# Patient Record
Sex: Female | Born: 1935 | Race: White | Hispanic: No | Marital: Married | Smoking: Never smoker
Health system: Southern US, Community
[De-identification: ages and names within clinical notes are randomized; demographics above are authoritative.]

## PROBLEM LIST (undated history)

## (undated) DIAGNOSIS — M1990 Unspecified osteoarthritis, unspecified site: Secondary | ICD-10-CM

## (undated) DIAGNOSIS — E785 Hyperlipidemia, unspecified: Secondary | ICD-10-CM

## (undated) DIAGNOSIS — K219 Gastro-esophageal reflux disease without esophagitis: Secondary | ICD-10-CM

## (undated) DIAGNOSIS — E039 Hypothyroidism, unspecified: Secondary | ICD-10-CM

## (undated) DIAGNOSIS — I10 Essential (primary) hypertension: Secondary | ICD-10-CM

## (undated) DIAGNOSIS — K5790 Diverticulosis of intestine, part unspecified, without perforation or abscess without bleeding: Secondary | ICD-10-CM

## (undated) DIAGNOSIS — T4145XA Adverse effect of unspecified anesthetic, initial encounter: Secondary | ICD-10-CM

## (undated) DIAGNOSIS — F419 Anxiety disorder, unspecified: Secondary | ICD-10-CM

## (undated) DIAGNOSIS — K2970 Gastritis, unspecified, without bleeding: Secondary | ICD-10-CM

## (undated) DIAGNOSIS — T8859XA Other complications of anesthesia, initial encounter: Secondary | ICD-10-CM

## (undated) HISTORY — DX: Hypothyroidism, unspecified: E03.9

## (undated) HISTORY — DX: Diverticulosis of intestine, part unspecified, without perforation or abscess without bleeding: K57.90

## (undated) HISTORY — PX: TONSILLECTOMY: SUR1361

## (undated) HISTORY — DX: Anxiety disorder, unspecified: F41.9

## (undated) HISTORY — PX: FEMORAL HERNIA REPAIR: SUR1179

## (undated) HISTORY — DX: Hyperlipidemia, unspecified: E78.5

## (undated) HISTORY — PX: TONSILLECTOMY: SHX5217

## (undated) HISTORY — DX: Gastro-esophageal reflux disease without esophagitis: K21.9

## (undated) HISTORY — PX: VAGINAL HYSTERECTOMY: SHX2639

## (undated) HISTORY — DX: Gastritis, unspecified, without bleeding: K29.70

## (undated) HISTORY — PX: JOINT REPLACEMENT: SHX530

## (undated) HISTORY — DX: Essential (primary) hypertension: I10

## (undated) HISTORY — PX: OTHER SURGICAL HISTORY: SHX169

## (undated) HISTORY — PX: ABDOMINAL HYSTERECTOMY: SHX81

## (undated) HISTORY — PX: BLADDER SURGERY: SHX569

---

## 2000-01-08 ENCOUNTER — Ambulatory Visit (HOSPITAL_COMMUNITY): Admission: RE | Admit: 2000-01-08 | Discharge: 2000-01-08 | Payer: Self-pay | Admitting: Gastroenterology

## 2000-01-08 ENCOUNTER — Encounter (INDEPENDENT_AMBULATORY_CARE_PROVIDER_SITE_OTHER): Payer: Self-pay

## 2000-01-17 ENCOUNTER — Encounter: Payer: Self-pay | Admitting: Family Medicine

## 2000-01-17 ENCOUNTER — Encounter: Admission: RE | Admit: 2000-01-17 | Discharge: 2000-01-17 | Payer: Self-pay | Admitting: Family Medicine

## 2000-01-24 ENCOUNTER — Encounter: Payer: Self-pay | Admitting: Urology

## 2000-01-24 ENCOUNTER — Encounter: Admission: RE | Admit: 2000-01-24 | Discharge: 2000-01-24 | Payer: Self-pay | Admitting: Urology

## 2000-05-14 ENCOUNTER — Encounter: Admission: RE | Admit: 2000-05-14 | Discharge: 2000-05-14 | Payer: Self-pay | Admitting: Family Medicine

## 2000-05-14 ENCOUNTER — Encounter: Payer: Self-pay | Admitting: Family Medicine

## 2000-08-18 ENCOUNTER — Other Ambulatory Visit: Admission: RE | Admit: 2000-08-18 | Discharge: 2000-08-18 | Payer: Self-pay | Admitting: Gynecology

## 2001-01-19 ENCOUNTER — Encounter: Admission: RE | Admit: 2001-01-19 | Discharge: 2001-01-19 | Payer: Self-pay | Admitting: Family Medicine

## 2001-01-19 ENCOUNTER — Encounter: Payer: Self-pay | Admitting: Family Medicine

## 2001-04-09 ENCOUNTER — Encounter: Payer: Self-pay | Admitting: *Deleted

## 2001-04-09 ENCOUNTER — Encounter: Admission: RE | Admit: 2001-04-09 | Discharge: 2001-04-09 | Payer: Self-pay | Admitting: *Deleted

## 2001-04-23 ENCOUNTER — Encounter: Payer: Self-pay | Admitting: *Deleted

## 2001-04-23 ENCOUNTER — Ambulatory Visit (HOSPITAL_COMMUNITY): Admission: RE | Admit: 2001-04-23 | Discharge: 2001-04-23 | Payer: Self-pay | Admitting: *Deleted

## 2001-05-12 ENCOUNTER — Encounter: Payer: Self-pay | Admitting: Endocrinology

## 2001-05-12 ENCOUNTER — Ambulatory Visit (HOSPITAL_COMMUNITY): Admission: RE | Admit: 2001-05-12 | Discharge: 2001-05-12 | Payer: Self-pay | Admitting: Endocrinology

## 2001-05-12 ENCOUNTER — Encounter (INDEPENDENT_AMBULATORY_CARE_PROVIDER_SITE_OTHER): Payer: Self-pay

## 2001-09-24 ENCOUNTER — Other Ambulatory Visit: Admission: RE | Admit: 2001-09-24 | Discharge: 2001-09-24 | Payer: Self-pay | Admitting: Gynecology

## 2002-02-03 ENCOUNTER — Encounter: Payer: Self-pay | Admitting: Family Medicine

## 2002-02-03 ENCOUNTER — Encounter: Admission: RE | Admit: 2002-02-03 | Discharge: 2002-02-03 | Payer: Self-pay | Admitting: Family Medicine

## 2002-02-17 ENCOUNTER — Encounter: Payer: Self-pay | Admitting: Family Medicine

## 2002-02-17 ENCOUNTER — Encounter: Admission: RE | Admit: 2002-02-17 | Discharge: 2002-02-17 | Payer: Self-pay | Admitting: Family Medicine

## 2003-02-23 ENCOUNTER — Encounter: Admission: RE | Admit: 2003-02-23 | Discharge: 2003-02-23 | Payer: Self-pay | Admitting: Gynecology

## 2003-02-23 ENCOUNTER — Encounter: Payer: Self-pay | Admitting: Gynecology

## 2003-11-14 ENCOUNTER — Other Ambulatory Visit: Admission: RE | Admit: 2003-11-14 | Discharge: 2003-11-14 | Payer: Self-pay | Admitting: Gynecology

## 2004-02-27 ENCOUNTER — Encounter: Admission: RE | Admit: 2004-02-27 | Discharge: 2004-02-27 | Payer: Self-pay | Admitting: Family Medicine

## 2004-11-21 ENCOUNTER — Other Ambulatory Visit: Admission: RE | Admit: 2004-11-21 | Discharge: 2004-11-21 | Payer: Self-pay | Admitting: Gynecology

## 2004-12-21 ENCOUNTER — Encounter: Admission: RE | Admit: 2004-12-21 | Discharge: 2004-12-21 | Payer: Self-pay | Admitting: Gastroenterology

## 2005-01-07 ENCOUNTER — Ambulatory Visit (HOSPITAL_COMMUNITY): Admission: RE | Admit: 2005-01-07 | Discharge: 2005-01-07 | Payer: Self-pay | Admitting: Gastroenterology

## 2005-03-11 ENCOUNTER — Encounter: Admission: RE | Admit: 2005-03-11 | Discharge: 2005-03-11 | Payer: Self-pay | Admitting: Family Medicine

## 2005-05-26 ENCOUNTER — Observation Stay (HOSPITAL_COMMUNITY): Admission: EM | Admit: 2005-05-26 | Discharge: 2005-05-28 | Payer: Self-pay | Admitting: Emergency Medicine

## 2005-11-27 ENCOUNTER — Other Ambulatory Visit: Admission: RE | Admit: 2005-11-27 | Discharge: 2005-11-27 | Payer: Self-pay | Admitting: Gynecology

## 2005-12-25 ENCOUNTER — Encounter: Admission: RE | Admit: 2005-12-25 | Discharge: 2005-12-25 | Payer: Self-pay | Admitting: Internal Medicine

## 2006-03-18 ENCOUNTER — Encounter: Admission: RE | Admit: 2006-03-18 | Discharge: 2006-03-18 | Payer: Self-pay | Admitting: Internal Medicine

## 2006-04-01 ENCOUNTER — Encounter: Admission: RE | Admit: 2006-04-01 | Discharge: 2006-04-01 | Payer: Self-pay | Admitting: General Surgery

## 2006-04-03 ENCOUNTER — Ambulatory Visit (HOSPITAL_BASED_OUTPATIENT_CLINIC_OR_DEPARTMENT_OTHER): Admission: RE | Admit: 2006-04-03 | Discharge: 2006-04-03 | Payer: Self-pay | Admitting: General Surgery

## 2006-04-04 ENCOUNTER — Emergency Department (HOSPITAL_COMMUNITY): Admission: EM | Admit: 2006-04-04 | Discharge: 2006-04-05 | Payer: Self-pay | Admitting: Emergency Medicine

## 2006-12-10 ENCOUNTER — Other Ambulatory Visit: Admission: RE | Admit: 2006-12-10 | Discharge: 2006-12-10 | Payer: Self-pay | Admitting: Gynecology

## 2007-03-20 ENCOUNTER — Encounter: Admission: RE | Admit: 2007-03-20 | Discharge: 2007-03-20 | Payer: Self-pay | Admitting: Internal Medicine

## 2007-04-07 ENCOUNTER — Encounter: Admission: RE | Admit: 2007-04-07 | Discharge: 2007-04-07 | Payer: Self-pay | Admitting: Gastroenterology

## 2007-11-04 ENCOUNTER — Encounter: Admission: RE | Admit: 2007-11-04 | Discharge: 2007-11-04 | Payer: Self-pay | Admitting: Internal Medicine

## 2008-01-22 ENCOUNTER — Inpatient Hospital Stay (HOSPITAL_COMMUNITY): Admission: RE | Admit: 2008-01-22 | Discharge: 2008-01-27 | Payer: Self-pay | Admitting: Orthopaedic Surgery

## 2008-03-30 ENCOUNTER — Encounter: Admission: RE | Admit: 2008-03-30 | Discharge: 2008-03-30 | Payer: Self-pay | Admitting: Internal Medicine

## 2008-04-22 ENCOUNTER — Encounter: Admission: RE | Admit: 2008-04-22 | Discharge: 2008-04-22 | Payer: Self-pay | Admitting: Orthopaedic Surgery

## 2008-10-31 ENCOUNTER — Ambulatory Visit (HOSPITAL_BASED_OUTPATIENT_CLINIC_OR_DEPARTMENT_OTHER): Admission: RE | Admit: 2008-10-31 | Discharge: 2008-11-01 | Payer: Self-pay | Admitting: Urology

## 2009-03-31 ENCOUNTER — Encounter: Admission: RE | Admit: 2009-03-31 | Discharge: 2009-03-31 | Payer: Self-pay | Admitting: Internal Medicine

## 2009-05-15 IMAGING — CT CT ABDOMEN W/ CM
1 of 5 series · 14 of 36 positions shown, 19 images · IV contrast (READICAT/WATER & [ID] OMNI 300)
Comparison: CT abdomen pelvis of 05/26/2005

CT ABDOMEN

CLINICAL DATA: Right lower quadrant abdominal pain, fell 2 weeks
ago

CT ABDOMEN AND PELVIS WITH CONTRAST
TECHNIQUE: Multidetector CT imaging of the abdomen and pelvis was
performed using the standard protocol following bolus
administration of intravenous contrast.
Contrast: 100 ml [id number]

[Series 2: abdomen w/ · axial · 0.70mm/px · z∈[-398,-23]mm · 14 of 85 slices shown, 19 images]
[im 5/85  soft-tissue]
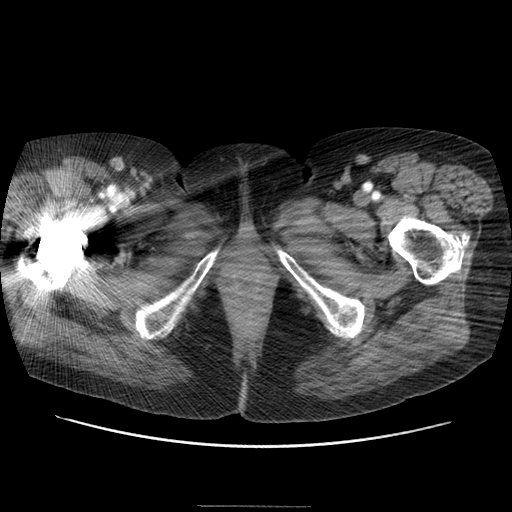
[im 5/85  bone]
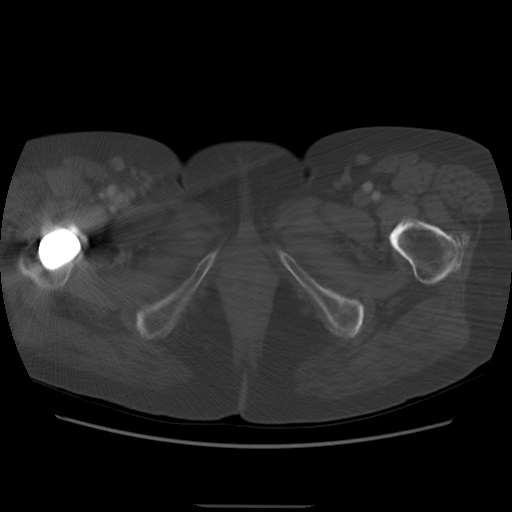
[im 10/85  soft-tissue]
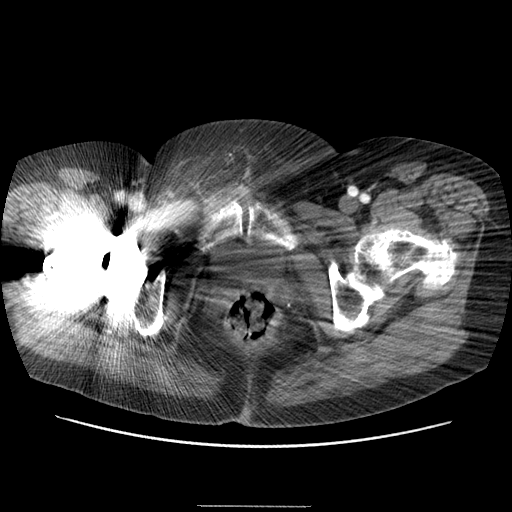
[im 20/85  soft-tissue]
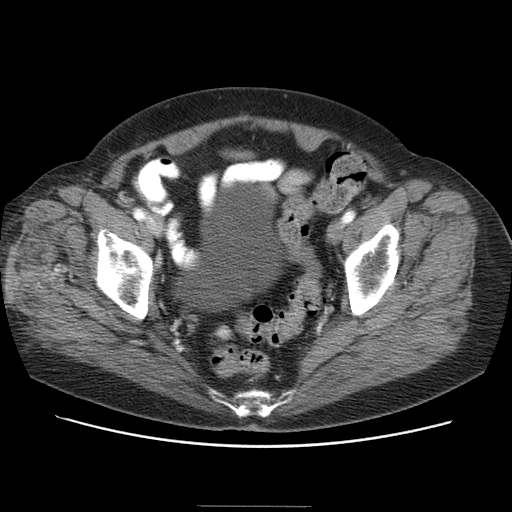
[im 25/85  soft-tissue]
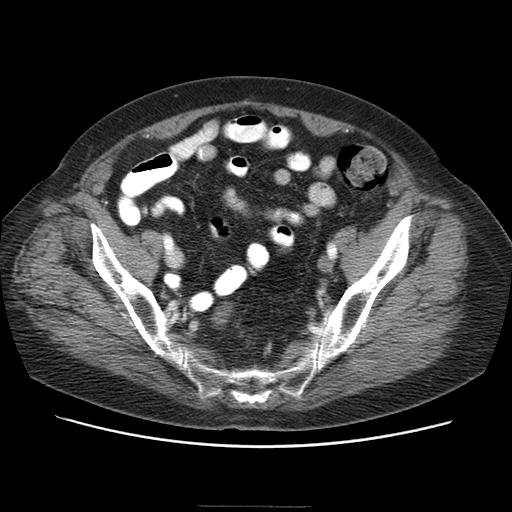
[im 30/85  soft-tissue]
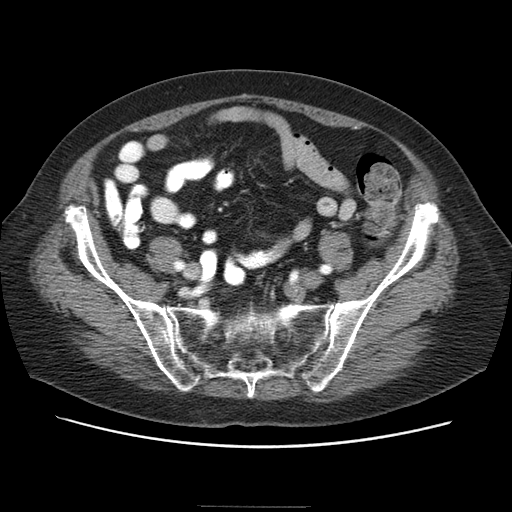
[im 35/85  soft-tissue]
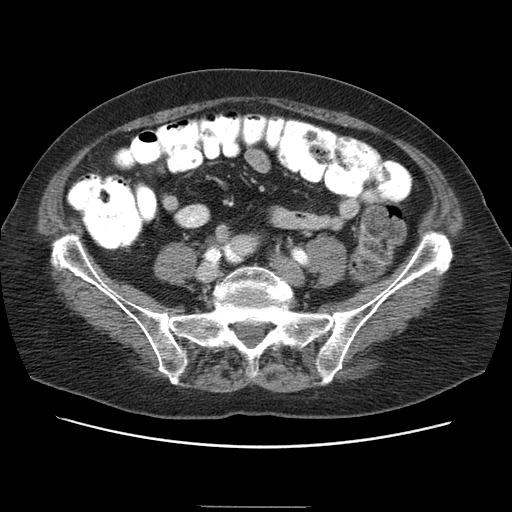
[im 45/85  soft-tissue]
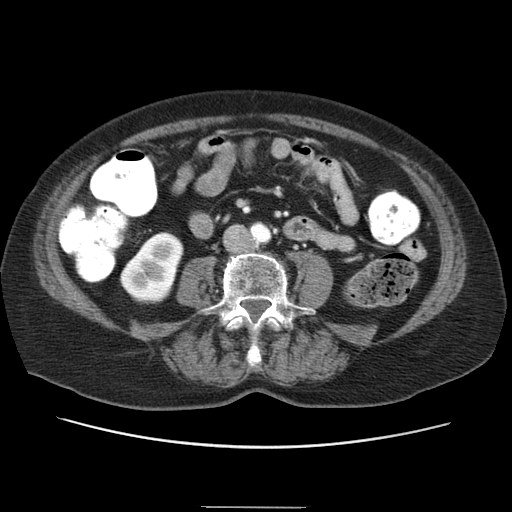
[im 50/85  soft-tissue]
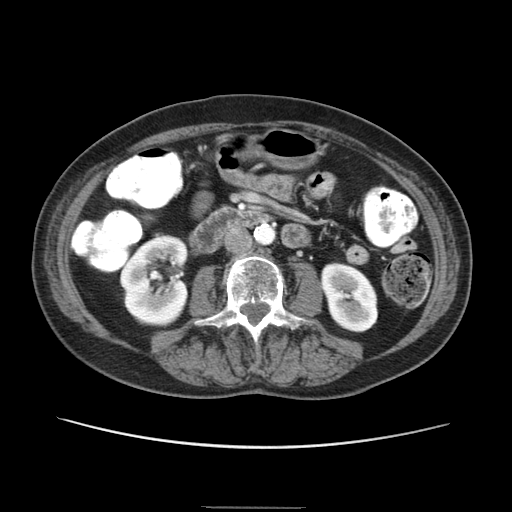
[im 55/85  soft-tissue]
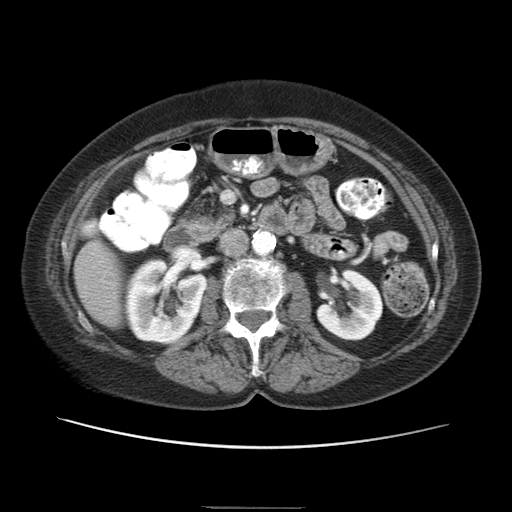
[im 55/85  bone]
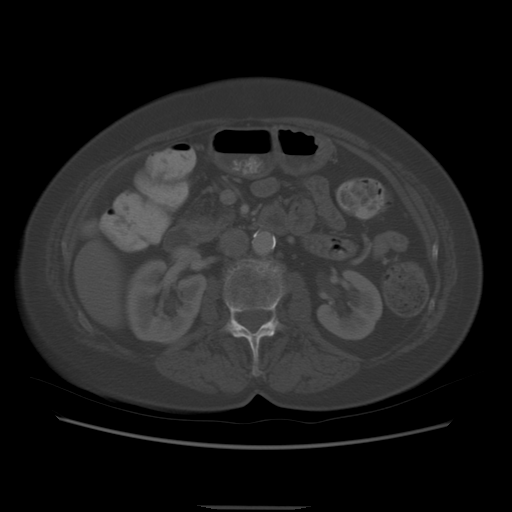
[im 60/85  soft-tissue]
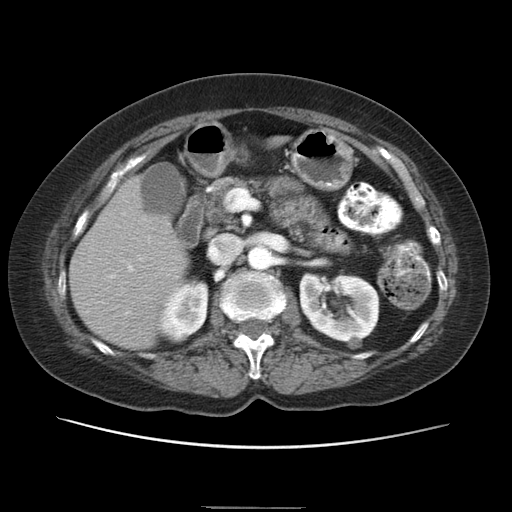
[im 65/85  soft-tissue]
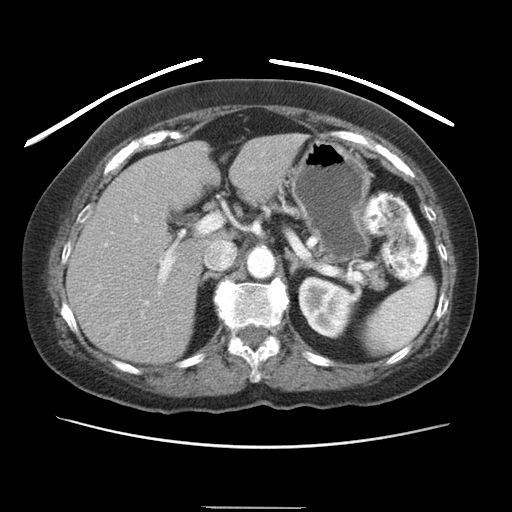
[im 65/85  lung]
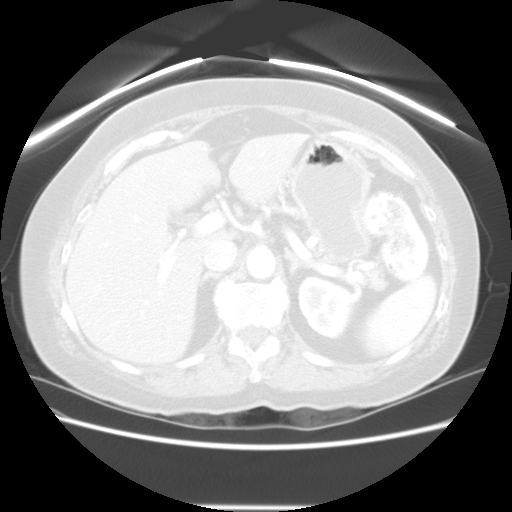
[im 70/85  lung]
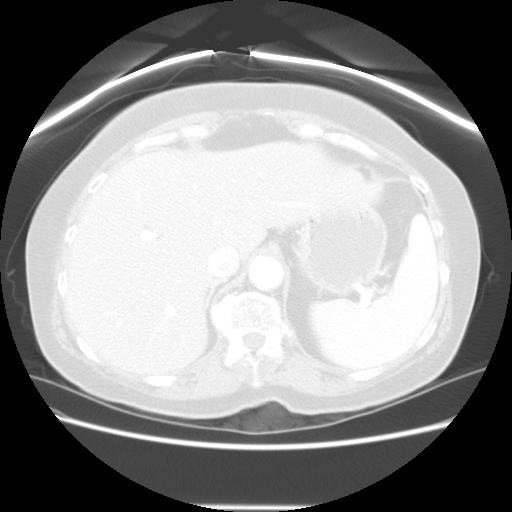
[im 75/85  soft-tissue]
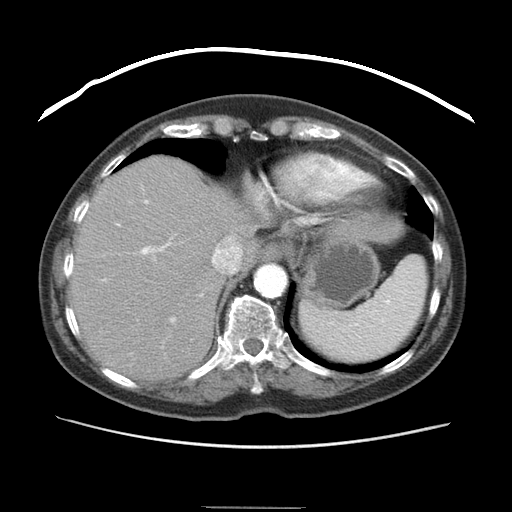
[im 75/85  lung]
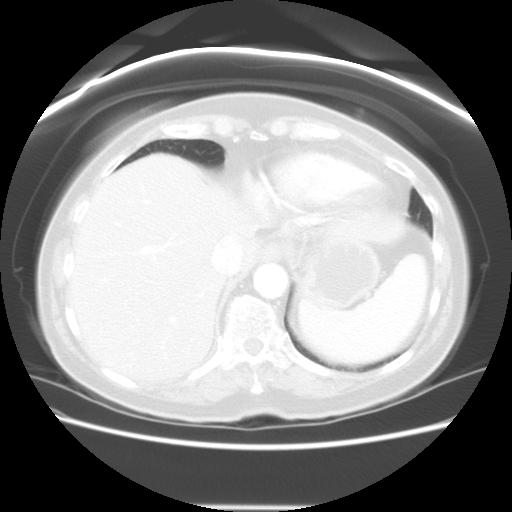
[im 80/85  soft-tissue]
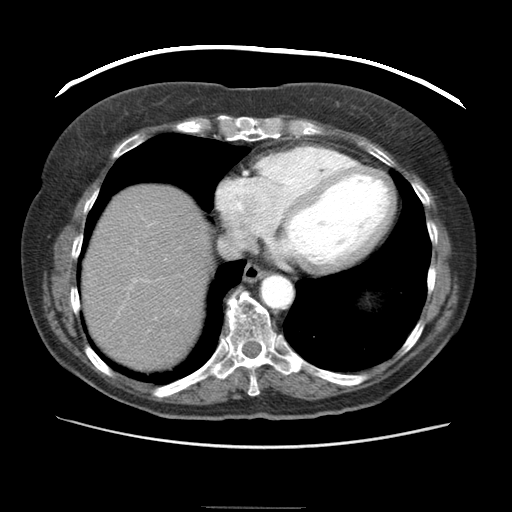
[im 80/85  lung]
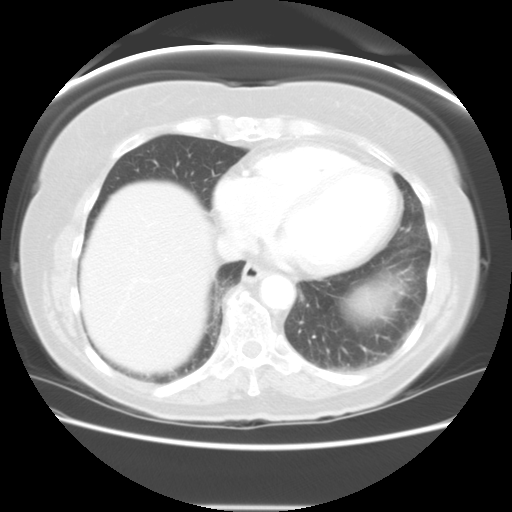

[14 of 36 positions shown; findings below may reference images not displayed]

FINDINGS: The lung bases are clear.  The liver enhances with no
focal abnormality and no ductal dilatation is seen.  A single
calcified granuloma is noted in the left lobe of liver and is
unchanged.  No calcified gallstones are seen.  The pancreas is
normal in size and the pancreatic duct is not dilated.  The adrenal
glands and spleen appear normal.  The kidneys enhance, and a small
exophytic cyst in the upper mid left kidney appears stable.  On
delayed images the pelvocaliceal systems appear normal.  Mild
atheromatous change is noted of the abdominal aorta but no aneurysm
is seen.
IMPRESSION: No significant abnormality on CT the abdomen.

CT PELVIS
FINDINGS: The urinary bladder is unremarkable although linear
artifacts as a result of right hip replacement do obscure anatomic
detail of the lower pelvis. The terminal ileum appears normal.  The
appendix is not definitely seen.  The bones are osteopenic.  There
is degenerative disc disease at L3-4.
IMPRESSION: No acute abnormality.  Linear artifacts from right hip replacement
do obscure the lower pelvis.

## 2010-04-02 ENCOUNTER — Encounter: Admission: RE | Admit: 2010-04-02 | Discharge: 2010-04-02 | Payer: Self-pay | Admitting: Internal Medicine

## 2010-07-11 ENCOUNTER — Ambulatory Visit (HOSPITAL_COMMUNITY): Admission: RE | Admit: 2010-07-11 | Payer: Self-pay | Source: Home / Self Care | Admitting: Urology

## 2010-07-13 ENCOUNTER — Ambulatory Visit (HOSPITAL_COMMUNITY)
Admission: RE | Admit: 2010-07-13 | Discharge: 2010-07-13 | Payer: Self-pay | Source: Home / Self Care | Attending: Urology | Admitting: Urology

## 2010-08-06 ENCOUNTER — Encounter: Payer: Self-pay | Admitting: Internal Medicine

## 2010-10-24 LAB — POCT I-STAT 4, (NA,K, GLUC, HGB,HCT)
Glucose, Bld: 96 mg/dL (ref 70–99)
HCT: 47 % — ABNORMAL HIGH (ref 36.0–46.0)

## 2010-11-27 NOTE — Op Note (Signed)
NAME:  CHARLITA, BRIAN             ACCOUNT NO.:  [id number]   MEDICAL RECORD NO.:  [id number]          PATIENT TYPE:  AMB   LOCATION:  NESC                         FACILITY:  [facility]   PHYSICIAN:  Sigmund I. Tannenbaum, M.D.DATE OF BIRTH:  1932/12/21   DATE OF PROCEDURE:  10/31/2008  DATE OF DISCHARGE:                               OPERATIVE REPORT   PREOPERATIVE DIAGNOSES:  Posterior pelvic floor prolapse, with the  apical descent, stress urinary continence.   OPERATION:  Posterior pelvic floor repair with posterior Pinnacle mesh,  and sacrospinous fixation, with Solyx pubovaginal sling.   SURGEON:  Sigmund I. Patsi Sears, M.D.   ANESTHESIA:  General LMA.   PREPARATION:  After appropriate preanesthesia, the patient is brought to  the operating room, placed on the operating table in dorsal supine  position where general LMA anesthesia was induced.  She was then  replaced in dorsal lithotomy position where the pubis was prepped with  Betadine solution and draped in usual fashion.   REVIEW OF HISTORY:  Mrs. Zettler is a 77 year old female, status post  bladder tack in 1999, who did well, until 2010, when she noticed  urinary incontinence, as well as a vaginal bulge.  She has been treated  with Estrace hormone vaginal replacement, but remained unhappy with the  vaginal bulge.  The patient is very active, as tour Engineer, mining, and is  on her feet all day.  She desires to have repair of her pelvic floor  prolapse.   PROCEDURE:  Vaginal pelvic examination indeed reveals of posterior  rectocele.  There is a grade one to two anterior vault prolapse, and it  is noted that the patient has a patulous urethra.  She has had  urodynamics, which has shown a stress urinary incontinence, with a  Valsalva leak point pressure of 86.  She also has some bladder  instability with a bladder contraction pressure of 9 cm of water.  Her  maximum bladder contraction pressure is 10 cm of water.   It was  elected to proceed with Solyx transurethral sling, and that the  patient would need sacrospinous fixation of apical descent for her  rectocele repair.  It is noted that the patient is status post  hysterectomy.  The anterior repair did not seem to be serious enough to  warrant repair at this time.   Marcaine with epinephrine 1:200,000 was injected in the periurethral  tissue.  The midline incision was made.  Subcutaneous tissue dissected  bilaterally to the level of the pubic tubercle.  Using the pre marked  Solyx sling, the wings of the sling were placed into the obturator  internus muscle by guiding the sling to the level of the pubic tubercle,  then going behind the pubic tubercle, at 90 degrees, and placing the  sling in the obturator internus bilaterally.  The sling was tested, and  pillowing was noted at the level of the urethra.  Right-angle clamp  easily went behind urethra, and it did not seem to be too tight.  The  wound was then closed with running 2-0 Vicryl suture.   Attention was then  placed on the rectocele, and it was elected to place  a Pinnacle posterior graft with sacrospinous fixation.  Therefore, 20 mL  of Marcaine with 1:200,000 epinephrine was injected at the rectal  mucosa.  A triangular incision was then made at the level of __________  gland, and midline incision was made.  Tissue was dissected with sharp  and blunt dissection, and the rectocele was completely dissected  laterally.  The ischial spines were then palpated.  Using the Capio  device, two throws were passed in the right and left sacrospinous  ligaments, and a the posterior Pinnacle, which was fashioned from a  regular Pinnacle, was placed without difficulty.  The mesh was trimmed,  and sutured to the peri cervical area with three separate 2-0 Vicryl  sutures.  The distal portion of the mesh was then trimmed to fit the  posterior vault, and this was also sutured to the posterior vaginal  tissue with  2-0 Vicryl suture.  The wings were brought through the  sacrospinous ligament, and excellent resolution of her rectocele was  noted, with some resolution of her cystocele as well.  The wings were  cut, plastic removed, excess mesh removed.  The wound was closed with  running 2-0 Vicryl suture.  Vaginal packing was placed, and cystoscopy  was accomplished, which showed no evidence of any suture in the bladder.  Clear urine was seen to emit from both orifices.  Digital rectal  examination prior to closure the rectocele revealed no evidence of the  rectal injury.  The patient tolerated procedure well.  She was given IV  Toradol, prior to awakening.  She was then awakened, taken to recovery  room in good condition.      Sigmund I. Patsi Sears, M.D.  Electronically Signed     SIT/MEDQ  D:  10/31/2008  T:  10/31/2008  Job:  [id number]

## 2010-11-27 NOTE — Op Note (Signed)
[name], [name] NO.:  [id number]   MEDICAL RECORD NO.:  [id number]          PATIENT TYPE:  INP   LOCATION:  5036                         FACILITY:  [facility]   PHYSICIAN:  Vanita Panda. Magnus Ivan, M.D.DATE OF BIRTH:  09/07/1932   DATE OF PROCEDURE:  01/22/2008  DATE OF DISCHARGE:                               OPERATIVE REPORT   PREOPERATIVE DIAGNOSIS:  Severe osteoarthritis/degenerative joint  disease, right hip.   POSTOPERATIVE DIAGNOSIS:  Severe osteoarthritis/degenerative joint  disease, right hip.   PROCEDURE:  Right total hip arthroplasty.   IMPLANTS:  DePuy ASR acetabular cup, size 54, size 47 hip ball, DePuy  Summit tapered hip stem with DuoFix HA coating size 6, -1 taper sleeve  adapter.   SURGEON:  Vanita Panda. Magnus Ivan, MD   ASSISTANT:  Wende Neighbors, PA   ANESTHESIA:  General.   ANTIBIOTIC:  600 mg IV clindamycin.   BLOOD LOSS:  430 mL.   COMPLICATIONS:  None.   INDICATIONS:  Briefly, [name] is a 77 year old that I have been  following for a while with severe osteoarthritis involving her right  hip.  She is a very healthy individual and I tried at least 2 different  steroid injections in her hip and she had good relief, but this is  really started to affect her activities of daily living severely.  We  thought about the total hip replacement.  She agreed to proceed with the  hip replacement after explaining the risks and benefits of this  including the risk of blood loss, DVT, and fatal PE.  The risks and  benefits of this were well understood and she agreed to proceed with  surgery.   DESCRIPTION OF PROCEDURE:  Informed consent was signed appropriately.  Right hip was marked.  She was brought to the operating room and placed  supine on the operating table.  General anesthesia was then obtained.  A  Foley catheter was then placed and then she was turned into a lateral  decubitus position with the right operative hip up.   Appropriate padding  was done with an axillary roll and on the down nonoperative leg.  We  then placed hip positioners in the front and the back.  Her right  operative hip was then prepped and draped with DuraPrep and sterile  drapes including sterile stockinette.  A time-out was called and she was  identified as the correct patient with correct right hip.  I then made  incision directly over the greater trochanter and carried this  proximally and distally.  I dissected down to the iliotibial band.  This  was divided longitudinally and I placed a Charnley retractor.  I then  took an anterior lateral approach to the gluteus medius and took down  about two-thirds of gluteus medius off the vastus ridge and reflected  this anteriorly.  I also reflected the gluteus minimus.  I then made a T-  type incision in the hip capsule and we could see there was abundant  arthritis all on the femoral head and neck area.  I then dislocated the  hip taking the  leg off the bed anteriorly.  I made a finger cut about a  fingerbreadth and a half above the level of lesser trochanter and  removed the femoral head in its entirety.  I could see there was  abundant wear of the femoral head and the acetabulum as well as there  were osteophytes around the acetabulum and we had to removed these.  I  then removed soft tissue from the acetabular section of the hip, and  then starting with size 45 reamer, reamed in 1-mm increments up to 53.  Once I felt that I got good bleeding bone, I was able to get my version  of the last set of reamers and then I placed the size 54 ASR cup after  trailing a size 54.  I felt this had a good rim fit then when the metal-  on-metal liner was placed, I did get an intraoperative x-ray to assess  my version as well.  I then turned to the femoral aspect of the case.  The leg was brought up to table anteriorly with flexion and internal  rotation.  I then used an initiating reamer guide and  canal finder to  access the femoral canal.  I then used a lateralizing reamer and then  reamed in 1-mm increments from size 1 up to the size 6 and 7 reamer.  I  then used broaches from size 3 up to size 6 and the size 6 broach felt  to have the nice, sturdy fit.  Given the quite conservative neck cut, I  needed to place a -1 trial adapter sleeve.  I then placed a size 47 head  and we reduced this into the acetabulum.  I put the hip through a range  of motion that felt to be stable and no shock and her leg lengths were  exactly equal.  This was quite difficult to redislocate, but we were  able to get her dislocated and removed all trial components and  thoroughly irrigated all the tissues.  I then once again placed the real  size 6 Summit proximal hydroxyapatite-coated stem without difficulties.  Once the stem was placed, I then placed the -1 liner in the real 47 head  and put this on.  We then reduced the hip back into the acetabulum and  put the hip through range of motion.  It was stable.  I then thoroughly  irrigated the tissues again and closed the joint capsule with  interrupted #1 Ethilon suture.  We reapproximate the gluteus intermedius  back to the vastus ridge and oversewed this using Ethibond suture as  well.  The IT band was reapproximated using interrupted #1 Vicryl suture  followed by 0 Vicryl in the deep tissue, 2-0 Vicryl in the subcutaneous  tissue in a running 4-0 Vicryl subcuticular suture.  Mepilex sterile  dressing was then applied and the patient was rolled into a supine  position.  Her leg lengths were found to be near equal.  She was  awakened, extubated, and taken to recovery room in stable condition.      Vanita Panda. Magnus Ivan, M.D.  Electronically Signed     CYB/MEDQ  D:  01/22/2008  T:  01/23/2008  Job:  [id number]

## 2010-11-30 NOTE — Op Note (Signed)
NAME:  Julie [name], Julie [name] NO.:  [id number]   MEDICAL RECORD NO.:  [id number]          PATIENT TYPE:  AMB   LOCATION:  ENDO                         FACILITY:  [facility]   PHYSICIAN:  Graylin Shiver, M.D.   DATE OF BIRTH:  08-May-1933   DATE OF PROCEDURE:  01/07/2005  DATE OF DISCHARGE:                                 OPERATIVE REPORT   INDICATION:  History of colon polyps.   Informed consent was obtained after explanation of the risks of bleeding,  infection and perforation.   MEDICATIONS:  Procedure was done after an EGD with an additional 25 mcg of  fentanyl and 2 mg of Versed given IV.   PROCEDURE:  With the patient in the left lateral decubitus position, a  rectal exam was performed.  No masses were felt.  The Olympus colonoscope  was inserted into the rectum and advanced around the colon to the cecum.  Cecal landmarks were identified.  The cecum and ascending colon were normal.  The transverse colon normal. The descending colon and sigmoid revealed  diverticulosis. The rectum was normal.  She tolerated the procedure well  without complications.   IMPRESSION:  Diverticulosis of the left colon.       SFG/MEDQ  D:  01/07/2005  T:  01/07/2005  Job:  [id number]   cc:   Joycelyn Rua, M.D.  [address] [place]  [place] [address]  Fax: [phone]

## 2010-11-30 NOTE — Op Note (Signed)
NAME:  Julie [name], SAMET NO.:  [id number]   MEDICAL RECORD NO.:  [id number]          PATIENT TYPE:  AMB   LOCATION:  NESC                         FACILITY:  [facility]   PHYSICIAN:  Adolph Pollack, M.D.DATE OF BIRTH:  [birth year]   DATE OF PROCEDURE:  04/03/2006  DATE OF DISCHARGE:                                 OPERATIVE REPORT   PREOPERATIVE DIAGNOSIS:  Right inguinal hernia.   POSTOPERATIVE DIAGNOSIS:  Direct right inguinal hernia.   PROCEDURE:  Right inguinal hernia repair with mesh.   SURGEON:  Adolph Pollack, M.D.   ANESTHESIA:  General plus 0.5% Marcaine local.   INDICATIONS:  This is a 77 year old female who is very physically active and  has had a painful bulge in the right groin consistent with right inguinal  hernia and now presents for repair.   TECHNIQUE:  She is seen in the holding area and the right groin marked with  my initials.  She was brought to the operating room, placed supine on the  operating table, and general anesthetic was administered.  The hair in the  right groin was clipped and the area sterilely prepped and draped.  Local  anesthetic was infiltrated superficially and deep in the right groin, and a  right groin incision was made through skin and subcutaneous tissue and  Scarpa fascia, exposing the external oblique aponeurosis.  Local anesthetic  was infiltrated deep to the external oblique aponeurosis, and it was incised  through the external ring medially up toward the anterior superior iliac  spine laterally.  Using blunt dissection, the shelving edge of the inguinal  ligament was exposed inferiorly and the internal oblique aponeurosis exposed  superiorly.  The round ligament was isolated and encircled, and  extraperitoneal fat from a direct hernia was noted to be adherent to this,  and it was dissected free from the round ligament using blunt dissection.  The ilioinguinal nerve was kept with the round ligament.   I then  loosely approximated the attenuated transversalis fascia with 3-0  Vicryl suture, keeping the extraperitoneal fat reduced.  Piece of 3 x 6-inch  polypropylene mesh was then brought into the field and anchored 1 cm medial  to the pubic tubercle with 2-0 Prolene suture.  The inferior aspect of mesh  was then anchored to the shelving edge of the inguinal ligament with a  running 2-0 Prolene suture to a level 1 cm lateral to the internal ring.  A  slit was cut in the mesh, and the 2 tails were wrapped around the round  ligament.  The superior aspect of the mesh was then anchored to the internal  oblique aponeurosis with interrupted 2-0 Vicryl sutures.  The two tails of  the mesh were crossed creating a new internal ring, and these were anchored  to the shelving edge of the inguinal ligament with a single 2-0 Prolene  suture.  The tip of a hemostat was able to the placed in the new aperture.   Following this, I injected more local anesthetic into the internal oblique  aponeurosis.  Hemostasis was adequate.  The external oblique  aponeurosis was  closed over the mesh and round ligament with a running 3-0 Vicryl suture.  Scarpa fascia's was closed with a running 2-0 Vicryl suture.  The skin was  closed with a running 4-0 Monocryl subcuticular stitch followed by Steri-  Strips and a sterile dressing.   She tolerated the procedure well without any apparent complications and was  taken to recovery room in satisfactory condition.      Adolph Pollack, M.D.  Electronically Signed     TJR/MEDQ  D:  04/03/2006  T:  04/04/2006  Job:  [id number]   cc:   Martha Clan

## 2010-11-30 NOTE — H&P (Signed)
[name], STJAMES NO.:  [id number]   MEDICAL RECORD NO.:  [id number]          PATIENT TYPE:  INP   LOCATION:  1827                         FACILITY:  [facility]   PHYSICIAN:  Jackie Plum, M.D.DATE OF BIRTH:  01-03-1933   DATE OF ADMISSION:  05/25/2005  DATE OF DISCHARGE:                                HISTORY & PHYSICAL   CHIEF COMPLAINT:  Abdominal pain, nausea and vomiting.   HISTORY OF PRESENT ILLNESS:  The patient presented to the ED with one-day  history of abdominal pain, nausea and vomiting.  The pain is mainly in the  upper abdominal area, described as crampy in nature with nausea and  vomiting.  Vomitus does not have any blood or dark discoloration.  It was  said to be severe initially, but had gone down to mild to moderate in  intensity at home.  The patient does not recall any known alleviating or  aggravating factors.  She denies any history of fever, chills, constipation.  No history of dysuria, frequency, or micturition.  She does not have any  cardiopulmonary symptomatology.  In the emergency room, the patient had a CT  scan of the abdomen done, which was said to be negative.  Full report is  pending, and I have not seen this report at this time.  She has had basic  chemistries done, which noted hypokalemia and hospitalist service was asked  to evaluate for admission for pain control and management of  gastroenteritis.  The patient denies any recent travels outside of the [place].  She is not taking in any food which has induced any problems in any of adult  members of her family or close associates.   PAST MEDICAL HISTORY:  1.  Hypothyroidism.  2.  Hypertension.   ALLERGIES:  No known drug allergies.   MEDICATIONS:  1.  Lisinopril.  2.  Synthroid.   FAMILY HISTORY:  Positive for heart disease.   SOCIAL HISTORY:  She lives with her husband.  No cigarettes or alcohol.   REVIEW OF SYSTEMS:  As stated above, otherwise unremarkable.   PHYSICAL EXAMINATION:  VITAL SIGNS:  Blood pressure was 147/72, temperature  97.2, pulse 97, respirations 16, O2 saturation 97% on room air.  GENERAL:  The patient was in mild to moderate painful distress, not in any  acute cardiopulmonary distress.  HEENT:  Bruising of her left orbital area because she ran into a door  recently.  She does not have any visual defects.  Pupils equal, round,  reactive to light.  Extraocular movements were intact.  Oropharynx was dry.  NECK:  Supple, no JVD.  LUNGS:  Clear to auscultation.  CARDIAC:  Tachycardic.  Regular was regular.  No gallops.  ABDOMEN:  Full, soft, with generalized tenderness without any rebound  tenderness.  EXTREMITIES:  No cyanosis.  NEUROLOGIC:  She was alert.  Alert and oriented x3.  No focal deficits.   LABORATORY DATA:  Laboratory work reviewed.  WBC count of 16.2, hemoglobin  16, hematocrit 47, MCV 91, platelet count 220.  Sodium 138, potassium 2.7,  chloride 104, glucose 153, BUN 17,  total bilirubin 1.1, alkaline phosphatase  56, AST 52, ALT 16, total protein 6.2, albumin 4.1.  Lipase 22.  UA was  negative for any urinary infection.   IMPRESSION:  1.  Gastroenteritis, presented with abdominal pain which is crampy in      nature, nausea and vomiting, and diarrhea.  2.  Hypokalemia secondary to gastrointestinal loss from diagnosis #1.  3.  Trace leukocytosis, likely stress related;  however, rule out infectious      etiology.   PLAN:  The patient will be admitted for IV fluid supplementation and other  supportive measures, including Lasix.  Potassium repletion instituted and  potassium level followed.  Leukocyte count was to be followed.  Stool for  culture for enteric antigens, as well as C. difficile toxin will be  obtained.      Jackie Plum, M.D.  Electronically Signed     GO/MEDQ  D:  05/26/2005  T:  05/26/2005  Job:  [id number]   cc:   [facility]. [facility]

## 2010-11-30 NOTE — Procedures (Signed)
[facility]  Patient:    Julie, [name]                    MRN: [id number] Proc. Date: 01/08/00 Adm. Date:  [id number] Attending:  Deneen Harts CC:         Marinda Elk, M.D., [facility]             Gretta Cool, M.D.                           Procedure Report  PROCEDURE:  Panendoscopy with biopsy.  ENDOSCOPIST:  Griffith Citron, M.D.  INDICATION:  [age]-year-old white female with symptoms of epigastric pain and associated postprandial bloating.  DESCRIPTION OF PROCEDURE:  Immediately following colonoscopy, patient was given additional IV sedation of Versed 2 mg IV; topical anesthetic applied to the posterior oropharynx.  Using an Olympus videoendoscope, proximal esophagus was intubated under direct vision.  The oropharynx was normal without lesion of the epiglottis, vocal cords or piriformis sinus.  The proximal, mid and distal segments of the esophagus were normal with a distinct mucosal Z-line at 37 cm.  No hiatal hernia was present.  No evidence of reflux disease.  The gastric fundus, body and antrum notable for diffuse inflammation.  This was characterized by atrophic-appearing mucosa, punctate submucosal hemorrhage, erythema, friability.  No nodularity, erosion or ulceration noted. The process was diffuse and involved all segments of the stomach equally.  Pylorus symmetric, easily traversed.  Normal duodenal bulb and second portion.  Retroflexed of the angularis, lesser curve, gastric cardia and fundus confirmed the above findings of a diffuse mucosal inflammation.  Biopsies were obtained from the angularis and prepyloric antrum for rapid urease testing. Stomach was decompressed and scope withdrawn.  Patient tolerated the procedure without difficulty, being maintained on datascope monitor and low-flow oxygen throughout.  ASSESSMENT:  Gastritis -- moderate, diffuse.  Helicobacter biopsy  pending.  RECOMMENDATIONS: 1. Prevacid 30 mg p.o. q.a.m. 2. Treat with Prevpac if Helicobacter biopsy positive. 3. Avoid gastric irritants. DD:  01/08/00 TD:  01/09/00 Job: [id number]

## 2010-11-30 NOTE — Discharge Summary (Signed)
NAMERITAL, CAVEY NO.:  [id number]   MEDICAL RECORD NO.:  [id number]          PATIENT TYPE:  INP   LOCATION:  5524                         FACILITY:  [facility]   PHYSICIAN:  Jackie Plum, M.D.DATE OF BIRTH:  February 01, 1933   DATE OF ADMISSION:  05/25/2005  DATE OF DISCHARGE:  05/28/2005                                 DISCHARGE SUMMARY   DISCHARGE DIAGNOSES:  1.  Gastroenteritis deemed viral etiology, resolved.  2.  History of hypothyroidism.  3.  History of hypertension.   DISCHARGE MEDICATIONS:  The patient is going to resume Lisinopril and  Synthroid as previously.  New medicine will be Imodium for patient to keep  with her and to take for diarrhea stools as needed.   She will follow up with her primary care physician, Dr. Joycelyn Rua, in  10 to 14 days.  She will have to call for appointment.  She is instructed to  report to her doctor or the emergency room if she has significant problems  including recurrence of symptoms.   DISCHARGE LABORATORY DATA:  Sodium 140, potassium 4.2, chloride 101, CO2 26,  glucose 84, BUN 4, creatinine 0.6, calcium 8.6.   CONDITION ON DISCHARGE:  Improved and satisfactory.   CONSULTATIONS:  Not applicable.   PROCEDURES:  Not applicable.   REASON FOR ADMISSION:  Abdominal pain, nausea and vomiting.   The patient presented with abdominal pain, nausea and vomiting.  The pain  was cramping in nature.  She had not had any fever or chills.  On admission,  patient's hemodynamically were stable.  Her mucous membranes were slightly  dry.  The abdomen was smooth and soft with mild generalized tenderness, and  there was no rebound tenderness.  She was alert and oriented.  Her lungs  were clear to auscultation.  Cardiac auscultation revealed tachycardia which  was global without any gallops or murmur.  She had a white count of 15,200,  and her chemistries were remarkable for hypokalemia of 2.7.  She was  admitted for __________  and hypokalemia.   HOSPITAL COURSE:  The patient was admitted to the hospitalists service.  Potassium was repleted.  She was started on supportive care including IV  fluid supplementation, antiemetics, and antinauseants.  The patient's  symptoms slowly improved, and she was deemed appropriate for discharge  today.  She had stool studies, and leukocyte was negative, and there was no  C. difficile toxin noted.  The patient has been feeling well.  Her last  stools  yesterday were well formed, and she had another well-formed stool today.  She is no more tachycardic.  She is hemodynamically stable with blood  pressure of 114/80, pulse 56, respirations 20, temperature 98.4 degrees F.  O2 saturation 96% on room air.  She is going to be discharged home for  outpatient followup.      Jackie Plum, M.D.  Electronically Signed     GO/MEDQ  D:  05/28/2005  T:  05/28/2005  Job:  [id number]   cc:   Joycelyn Rua, M.D.  Fax: [phone]

## 2010-11-30 NOTE — Op Note (Signed)
NAME:  Julie [name], CHIEFFO NO.:  [id number]   MEDICAL RECORD NO.:  [id number]          PATIENT TYPE:  AMB   LOCATION:  ENDO                         FACILITY:  [facility]   PHYSICIAN:  Graylin Shiver, M.D.   DATE OF BIRTH:  [birth year]   DATE OF PROCEDURE:  01/07/2005  DATE OF DISCHARGE:                                 OPERATIVE REPORT   INDICATIONS FOR PROCEDURE:  Epigastric abdominal pain.   Informed consent was obtained after explanation of the risks of bleeding,  infection and perforation.   PREMEDICATION:  Fentanyl 50 mcg IV, Versed 5.5 mg IV.   PROCEDURE:  With the patient in the left lateral decubitus position, the  Olympus gastroscope was inserted into the oropharynx and passed into the  esophagus.  It was advanced down the esophagus then into the stomach and  into the duodenum.  The second portion and bulb of the duodenum looked  normal.  The stomach looked normal in its entirety including the upper  fundus and cardia seen on retroflexion.  The esophagus looked normal.  The  esophagogastric junction was at 36 cm.  She tolerated the procedure well  without complications.   IMPRESSION:  Normal esophagogastroduodenoscopy.   I see nothing on this exam to explain upper abdominal pain.       SFG/MEDQ  D:  01/07/2005  T:  01/07/2005  Job:  [id number]   cc:   Joycelyn Rua, M.D.  [address] [place]  [place] [address]  Fax: [phone]

## 2010-11-30 NOTE — Procedures (Signed)
[facility]  Patient:    Julie [name], Julie [name]                    MRN: [id number] Proc. Date: 01/08/00 Adm. Date:  [id number] Disc. Date: [id number] Attending:  Deneen Harts CC:         Ranae Plumber, M.D., [facility]             Gretta Cool, M.D.                           Procedure Report  PROCEDURE:  Colonoscopy.  INDICATIONS:  A 77 year old white female undergoing colonoscopy for neoplasia surveillance.  Prior history of polyps resected.  Last colonoscopy performed December 1997.  No interim difficulties.  Presently asymptomatic.  Bowel habits regular.  Formed stool, no hematochezia, no change in stool caliber.  DESCRIPTION OF PROCEDURE:  After reviewing the nature of the procedure with the patient, including potential risks and complications of hemorrhage and perforation, informed consent was signed.  The patient was premedicated, receiving intravenous sedation, totalling Versed 8 mg and fentanyl 100 mcg administered in divided doses prior to and during the course of the procedure.  Using a pediatric PCF-100 video colonoscope, the rectum was intubated after digital examination revealed no evidence of perianal or intrarectal pathology. The scope was inserted and advanced around the entire length of the colon to the cecum, identified by the appendiceal orifice and ileocecal valve.  Phot documentation obtained.  Scope was slowly withdrawn with careful inspection of the entire colon in a retrograde manner, including retroflex view in the rectal vault.  The only abnormality noted was a diminutive rectal polyp.  This measured approximately 4 mm and was resected with hot biopsy forceps, recovered and submitted to pathology.  No additional pathology identified.  No evidence of vascular lesion, mucosal inflammation or diverticular change.  Colon was decompressed and scope withdrawn.  Patient tolerated the procedure without difficulty,  being maintained on Datascope monitor and low flow oxygen throughout.  Time 2, technical 2, preparation 1, total score equals 5.  ASSESSMENT:  Rectal polyp - diminutive, resected.  RECOMMENDATIONS: 1. Follow up pathology. 2. Postpolypectomy instructions reviewed. 3. Repeat colonoscopy in five years. 4. Annual Hemoccult. DD:  01/08/00 TD:  01/09/00 Job: [id number]

## 2010-11-30 NOTE — Discharge Summary (Signed)
NAMEBLONDINA, CODERRE NO.:  [id number]   MEDICAL RECORD NO.:  [id number]          PATIENT TYPE:  INP   LOCATION:  5036                         FACILITY:  [facility]   PHYSICIAN:  Vanita Panda. Magnus Ivan, M.D.DATE OF BIRTH:  [birth year]   DATE OF ADMISSION:  01/22/2008  DATE OF DISCHARGE:  01/27/2008                               DISCHARGE SUMMARY   ADMITTING DIAGNOSIS:  Right hip osteoarthritis and severe degenerative  joint disease.   DISCHARGE DIAGNOSIS:  Right hip severe degenerative joint disease and  osteoarthritis.   PROCEDURE:  Right total hip replacement on January 22, 2008.   HOSPITAL COURSE:  Briefly, Ms. Novitski is a very active 77 year old  female with severe debilitating arthritis involving her right hip.  This  is well documented on x-rays and we tried to temporize this with steroid  injections in her hip as well as anti-inflammatory pain medications.  She got to the point where this is affecting her activities of daily  living and she wished to proceed with total hip replacement.  I  counseled her on all these extensively as well as counseled her about  the risks and benefits of surgery and the risk of DVT and fatal PE.  After discussion, she agreed to proceed with hip surgery.  She was taken  to the operating room on the day of admission where she underwent a  right total hip replacement without complications.  For detailed  description of operation, please refer the dictated operative note in  the patient's medical record.  Postoperatively, she progressed quite  well with physical therapy with weightbearing as tolerated on her right  hip and anterior hip precautions.  By the day of discharge, she was  tolerating regular diet as well as oral pain medications.  It was felt  that she could be discharged safely to home with close home health  followup with physical therapy and home health pharmacy to adjust her  Coumadin levels.   DISPOSITION:  To  home.   DISCHARGE INSTRUCTIONS:  While she is at home, she can get her incision  wet in shower.  She will otherwise keep it clean and dry.  She will  continue to weight bear as tolerated with anterior hip precautions.  Home health pharmacy will come in for adjusting her Coumadin levels as  well.  Followup in the office will be in 2 weeks postoperative period.   DISCHARGE MEDICATIONS:  1. Coumadin.  2. Percocet  3. Robaxin.   For additional medications please see the patient's medical record,  discharge orders.      Vanita Panda. Magnus Ivan, M.D.  Electronically Signed    CYB/MEDQ  D:  02/23/2008  T:  02/24/2008  Job:  [id number]

## 2010-12-17 ENCOUNTER — Telehealth: Payer: Self-pay | Admitting: Internal Medicine

## 2010-12-17 NOTE — Telephone Encounter (Signed)
Patient cx same day appt due to not feeling well, should get next open new pt slot, she will not be worked back in .

## 2010-12-17 NOTE — Telephone Encounter (Signed)
History with Ganem in 08.  Julie [name] has those records and will send them.  Patient is having rectal bleeding.  I have scheduled an appt for Dr Jarold Motto for tomorrow at 3:15

## 2010-12-18 ENCOUNTER — Ambulatory Visit (INDEPENDENT_AMBULATORY_CARE_PROVIDER_SITE_OTHER): Payer: MEDICARE | Admitting: Gastroenterology

## 2010-12-18 ENCOUNTER — Encounter: Payer: Self-pay | Admitting: Gastroenterology

## 2010-12-18 ENCOUNTER — Other Ambulatory Visit (INDEPENDENT_AMBULATORY_CARE_PROVIDER_SITE_OTHER): Payer: Medicare Other

## 2010-12-18 ENCOUNTER — Ambulatory Visit: Payer: Self-pay | Admitting: Gastroenterology

## 2010-12-18 DIAGNOSIS — K2950 Unspecified chronic gastritis without bleeding: Secondary | ICD-10-CM

## 2010-12-18 DIAGNOSIS — K219 Gastro-esophageal reflux disease without esophagitis: Secondary | ICD-10-CM

## 2010-12-18 DIAGNOSIS — R945 Abnormal results of liver function studies: Secondary | ICD-10-CM

## 2010-12-18 DIAGNOSIS — Z79899 Other long term (current) drug therapy: Secondary | ICD-10-CM

## 2010-12-18 DIAGNOSIS — K5730 Diverticulosis of large intestine without perforation or abscess without bleeding: Secondary | ICD-10-CM

## 2010-12-18 DIAGNOSIS — R1013 Epigastric pain: Secondary | ICD-10-CM

## 2010-12-18 DIAGNOSIS — K2940 Chronic atrophic gastritis without bleeding: Secondary | ICD-10-CM

## 2010-12-18 DIAGNOSIS — G8929 Other chronic pain: Secondary | ICD-10-CM | POA: Insufficient documentation

## 2010-12-18 DIAGNOSIS — K625 Hemorrhage of anus and rectum: Secondary | ICD-10-CM

## 2010-12-18 LAB — IBC PANEL
Saturation Ratios: 21.5 % (ref 20.0–50.0)
Transferrin: 286.3 mg/dL (ref 212.0–360.0)

## 2010-12-18 MED ORDER — RABEPRAZOLE SODIUM 20 MG PO TBEC: 20.0000 mg | DELAYED_RELEASE_TABLET | Freq: Every day | ORAL | Status: DC

## 2010-12-18 MED ORDER — PEG-KCL-NACL-NASULF-NA ASC-C 100 G PO SOLR: 1.0000 | Freq: Once | ORAL | Status: DC

## 2010-12-18 NOTE — Patient Instructions (Addendum)
Your procedure has been scheduled for 01/09/2011, please follow the seperate instructions.  Your prescription(s) have been sent to you pharmacy.  Please go to the basement today for your labs.

## 2010-12-18 NOTE — Progress Notes (Signed)
History of Present Illness:  This is a very pleasant 77 year old Caucasian female referred for evaluation of 2 episodes of bright red blood per rectum the last several weeks. She has chronic functional constipation and uses when necessary MiraLax. She describes a vague lower abdominal discomfort some mild general malaise. Recent labs showed mild hypokalemia related to diuretic therapy, and she has been on potassium replacement therapy. Previous evaluation by Dr.Ganem in 2006 showed diverticulosis, and a hyperplastic rectal polyp. Endoscopy showed diffuse inflammatory gastritis. CLO biopsy results are not available.  The patient continued with vague epigastric discomfort but no anorexia, weight loss, or hepatobiliary complaints. She denies any food intolerances. Recent labs show normal CBC and metabolic profile except for slightly elevated AST 51. There is no past history of known liver function test abnormalities, alcohol abuse, IV drug use, or familial liver disease except in a brother that had alcoholic cirrhosis. The history also is positive for uterine and breast carcinoma.  I have reviewed this patient's present history, medical and surgical past history, allergies and medications.     ROS: The remainder of the 10 point ROS is negative     Physical Exam: General well developed well nourished patient in no acute distress, appearing her stated age. No stigmata of chronic liver disease noted. Eyes PERRLA, no icterus, fundoscopic exam per opthamologist Skin no lesions noted Neck supple, no adenopathy, no thyroid enlargement, no tenderness Chest clear to percussion and auscultation Heart no significant murmurs, gallops or rubs noted Abdomen no hepatosplenomegaly masses or tenderness, BS normal.  Rectal inspection normal no fissures, or fistulae noted.  No masses or tenderness on digital exam. No stool present for guaiac testing. Extremities no acute joint lesions, edema, phlebitis or evidence  of cellulitis. Neurologic patient oriented x 3, cranial nerves intact, no focal neurologic deficits noted. Psychological mental status normal and normal affect.  Assessment and plan: Probable hemorrhoidal bleeding, rule out colonic polyposis. I have ordered an anemia profile, and we'll schedule outpatient colonoscopy with sedation. She continued with epigastric discomfort , and I placed her on AcipHex 20 mg a day, scheduled endoscopic exam with multiple biopsies to exclude H. pylori infection. I agree with repeating her liver function test in 3 months before further liver evaluation. There no stigmata of chronic liver disease or organomegaly on abdominal exam noted. She is to continue her thyroid placement and when necessary MiraLax. Her blood pressure today is 172/82 and she is on Norvasc 5 mg a day, Hyzaar 125 mg a day, and estradiol.  No diagnosis found.

## 2010-12-19 ENCOUNTER — Encounter: Payer: Self-pay | Admitting: Gastroenterology

## 2010-12-19 ENCOUNTER — Telehealth: Payer: Self-pay | Admitting: Gastroenterology

## 2010-12-19 NOTE — Telephone Encounter (Signed)
Advised to take aciphex once a day

## 2010-12-31 ENCOUNTER — Telehealth: Payer: Self-pay | Admitting: Gastroenterology

## 2010-12-31 MED ORDER — OMEPRAZOLE 40 MG PO CPDR: 40.0000 mg | DELAYED_RELEASE_CAPSULE | Freq: Every day | ORAL | Status: DC

## 2010-12-31 NOTE — Telephone Encounter (Signed)
Omeprazole rx sent in place of Aciphex script due to insurance coverage.

## 2010-12-31 NOTE — Telephone Encounter (Signed)
Spoke with patient and advised her that she will need to contact her insurance company to find out which PPI they prefer. She needs to call us back with options and we will go from there. Patient verbalizes understanding.

## 2011-01-09 ENCOUNTER — Ambulatory Visit (AMBULATORY_SURGERY_CENTER): Payer: MEDICARE | Admitting: Gastroenterology

## 2011-01-09 ENCOUNTER — Encounter: Payer: Self-pay | Admitting: Gastroenterology

## 2011-01-09 DIAGNOSIS — K5730 Diverticulosis of large intestine without perforation or abscess without bleeding: Secondary | ICD-10-CM

## 2011-01-09 DIAGNOSIS — K219 Gastro-esophageal reflux disease without esophagitis: Secondary | ICD-10-CM

## 2011-01-09 DIAGNOSIS — K625 Hemorrhage of anus and rectum: Secondary | ICD-10-CM

## 2011-01-09 DIAGNOSIS — K2970 Gastritis, unspecified, without bleeding: Secondary | ICD-10-CM

## 2011-01-09 MED ORDER — SODIUM CHLORIDE 0.9 % IV SOLN: 500.0000 mL | INTRAVENOUS | Status: DC

## 2011-01-09 NOTE — Patient Instructions (Signed)
Please red the handouts given to you by your recovery room nurse.  Increase the fiber in your diet, and it will help your diverticulosis.  We will call you if the biopsy for h-pylori bacteria is positive.   You may resume all of your routine medications today.   Please call the office to make an appointment to see Dr. Jarold Motto in two weeks.  Please, call tomorrow as his appointments go fast.   If you have any questions or concerns, please call us at [phone].  Thank-you.

## 2011-01-10 ENCOUNTER — Telehealth: Payer: Self-pay

## 2011-01-10 DIAGNOSIS — K219 Gastro-esophageal reflux disease without esophagitis: Secondary | ICD-10-CM

## 2011-01-10 LAB — HELICOBACTER PYLORI SCREEN-BIOPSY: UREASE: NEGATIVE

## 2011-01-10 NOTE — Telephone Encounter (Signed)
Left message on answering machine. 

## 2011-01-31 ENCOUNTER — Ambulatory Visit (INDEPENDENT_AMBULATORY_CARE_PROVIDER_SITE_OTHER): Payer: MEDICARE | Admitting: Gastroenterology

## 2011-01-31 ENCOUNTER — Other Ambulatory Visit (INDEPENDENT_AMBULATORY_CARE_PROVIDER_SITE_OTHER): Payer: Medicare Other

## 2011-01-31 ENCOUNTER — Encounter: Payer: Self-pay | Admitting: Gastroenterology

## 2011-01-31 VITALS — BP 124/76 | HR 68 | Ht 64.0 in | Wt 149.0 lb

## 2011-01-31 DIAGNOSIS — R1011 Right upper quadrant pain: Secondary | ICD-10-CM

## 2011-01-31 DIAGNOSIS — R7989 Other specified abnormal findings of blood chemistry: Secondary | ICD-10-CM

## 2011-01-31 DIAGNOSIS — G8929 Other chronic pain: Secondary | ICD-10-CM | POA: Insufficient documentation

## 2011-01-31 DIAGNOSIS — K219 Gastro-esophageal reflux disease without esophagitis: Secondary | ICD-10-CM | POA: Insufficient documentation

## 2011-01-31 DIAGNOSIS — K5901 Slow transit constipation: Secondary | ICD-10-CM

## 2011-01-31 DIAGNOSIS — R945 Abnormal results of liver function studies: Secondary | ICD-10-CM

## 2011-01-31 LAB — CBC WITH DIFFERENTIAL/PLATELET
Basophils Relative: 0.5 % (ref 0.0–3.0)
Eosinophils Relative: 1.2 % (ref 0.0–5.0)
HCT: 41.4 % (ref 36.0–46.0)
Hemoglobin: 14.3 g/dL (ref 12.0–15.0)
Lymphs Abs: 1.4 10*3/uL (ref 0.7–4.0)
MCHC: 34.4 g/dL (ref 30.0–36.0)
MCV: 95.2 fl (ref 78.0–100.0)
Monocytes Absolute: 0.6 10*3/uL (ref 0.1–1.0)
Neutro Abs: 3.5 10*3/uL (ref 1.4–7.7)
RBC: 4.34 Mil/uL (ref 3.87–5.11)
WBC: 5.5 10*3/uL (ref 4.5–10.5)

## 2011-01-31 LAB — COMPREHENSIVE METABOLIC PANEL
AST: 45 U/L — ABNORMAL HIGH (ref 0–37)
Alkaline Phosphatase: 44 U/L (ref 39–117)
BUN: 14 mg/dL (ref 6–23)
Glucose, Bld: 101 mg/dL — ABNORMAL HIGH (ref 70–99)
Total Bilirubin: 0.8 mg/dL (ref 0.3–1.2)

## 2011-01-31 NOTE — Progress Notes (Signed)
This is a extremely pleasant [age]-year-old Caucasian female who recently underwent endoscopy and colonoscopy. Endoscopy was unremarkable as was colonoscopy. Her constipation has resolved on the right her MiraLax ounces at bedtime. Her acid reflux has responded to omeprazole 40 mg a day. She currently is asymptomatic in terms of gastrointestinal complaints except for some vague continued discomfort in her right upper quadrant area. Review of her chart does show periodically abnormal liver function tests of questionable etiology. Recent CBC and iron studies were normal. She denies a history of hepatitis, known gallbladder or liver disease, pruritus, or mental status changes. There is no history of chronic fatigue, skin rashes, joint pains, fever or chills.  Current Medications, Allergies, Past Medical History, Past Surgical History, Family History and Social History were reviewed in [employer] record.  Pertinent Review of Systems Negative   Physical Exam: Awake and alert no acute distress appears stated age. I cannot appreciate stigmata of chronic liver disease. Chest is clear cardiac exam is unremarkable. There is no hepatosplenomegaly, abdominal masses or tenderness. Bowel sounds are normal and there is no abdominal bruit. Peripheral extremities are unremarkable. Mental status is normal and there is no asterixis.    Assessment and Plan: Chronic constipation responded to daily MiraLax. Also has reflux under good control with daily omeprazole 40 mg. We will evaluate her abnormal liver function tests were standard hepatic metabolic workup, a review for previous hepatitis, autoimmune disease, and also check upper abdominal ultrasound exam. I will see her back in 6-8 weeks' time for followup. Continue her other medications per Dr. Clelia Croft in internal medicine.  Please copy her primary care physician, referring physician, and pertinent subspecialists.   Encounter Diagnosis  Name  Primary?  . Abnormal liver function tests Yes

## 2011-01-31 NOTE — Patient Instructions (Signed)
You have been scheduled for an abdominal ultrasound at [facility] Radiology (1st floor of hospital) on 02/05/11 (Tuesday) at 8:00 am. Please arrive 15 minutes prior to your appointment for registration. Make certain not to have anything to eat or drink 6 hours prior to your appointment. Should you need to reschedule your appointment, please contact radiology at [phone]. Your physician has requested that you go to the basement for the following lab work before leaving today: [employer], AMA, ANA, Ceruloplasmin, Hepatitis B Antigen, Hepatitis C Anti HCV, Sprue Panel 10. You have been scheduled for a follow up appointment with Dr Jarold Motto on 02/12/11 @ 3:45 pm.

## 2011-02-01 LAB — CELIAC PANEL 10
Endomysial Screen: NEGATIVE
IgA: 94 mg/dL (ref 69–380)
Tissue Transglut Ab: 5.1 U/mL (ref <20)

## 2011-02-01 LAB — HEPATITIS C ANTIBODY: HCV Ab: NEGATIVE

## 2011-02-01 LAB — HEPATITIS B SURFACE ANTIGEN: Hepatitis B Surface Ag: NEGATIVE

## 2011-02-04 ENCOUNTER — Telehealth: Payer: Self-pay | Admitting: *Deleted

## 2011-02-04 LAB — MITOCHONDRIAL ANTIBODIES: Mitochondrial M2 Ab, IgG: 0.2 (ref <0.91)

## 2011-02-04 NOTE — Telephone Encounter (Signed)
Message copied by Leonette Monarch on Mon Feb 04, 2011  1:43 PM ------      Message from: Jarold Motto, DAVID R      Created: Mon Feb 04, 2011  8:24 AM       Liver w/u is normal,repeat liver tests 6 mos.Marland KitchenMarland Kitchen

## 2011-02-04 NOTE — Telephone Encounter (Signed)
Advised pt of her lab result and I will call to remind her she needs lfts in jan.

## 2011-02-05 ENCOUNTER — Ambulatory Visit (HOSPITAL_COMMUNITY)
Admission: RE | Admit: 2011-02-05 | Discharge: 2011-02-05 | Disposition: A | Discharge status: Home / Self Care | Payer: Medicare Other | Source: Ambulatory Visit | Attending: Gastroenterology | Admitting: Gastroenterology

## 2011-02-05 DIAGNOSIS — R7989 Other specified abnormal findings of blood chemistry: Secondary | ICD-10-CM | POA: Insufficient documentation

## 2011-02-05 DIAGNOSIS — K7689 Other specified diseases of liver: Secondary | ICD-10-CM | POA: Insufficient documentation

## 2011-02-05 DIAGNOSIS — R945 Abnormal results of liver function studies: Secondary | ICD-10-CM

## 2011-02-06 ENCOUNTER — Telehealth: Payer: Self-pay | Admitting: *Deleted

## 2011-02-06 NOTE — Telephone Encounter (Signed)
Left message for pt to call back if she has questions. But that I will contact her when she is due for labs in one year.

## 2011-02-06 NOTE — Telephone Encounter (Signed)
Message copied by Leonette Monarch on Wed Feb 06, 2011  8:13 AM ------      Message from: Jarold Motto, DAVID R      Created: Tue Feb 05, 2011  5:19 PM       Be sure there is no iron or multivitamins. Her liver evaluation is negative except for mild fatty infiltration. I would recommend yearly liver function test, but I do not think she needs a further hepatic workup at this time.

## 2011-02-07 ENCOUNTER — Telehealth: Payer: Self-pay | Admitting: *Deleted

## 2011-02-07 NOTE — Telephone Encounter (Signed)
Pt called in and asked if she had to keep her f/u on 02/12/11? Informed her Dr Jarold Motto stated in his notes he wants to see her. Pt stated she will be here.

## 2011-02-12 ENCOUNTER — Ambulatory Visit (INDEPENDENT_AMBULATORY_CARE_PROVIDER_SITE_OTHER): Payer: Medicare Other | Admitting: Gastroenterology

## 2011-02-12 ENCOUNTER — Ambulatory Visit: Payer: MEDICARE | Admitting: Gastroenterology

## 2011-02-12 ENCOUNTER — Encounter: Payer: Self-pay | Admitting: Gastroenterology

## 2011-02-12 VITALS — BP 142/72 | HR 60 | Ht 64.0 in | Wt 147.2 lb

## 2011-02-12 DIAGNOSIS — K760 Fatty (change of) liver, not elsewhere classified: Secondary | ICD-10-CM

## 2011-02-12 DIAGNOSIS — K5901 Slow transit constipation: Secondary | ICD-10-CM

## 2011-02-12 DIAGNOSIS — K219 Gastro-esophageal reflux disease without esophagitis: Secondary | ICD-10-CM

## 2011-02-12 DIAGNOSIS — K7689 Other specified diseases of liver: Secondary | ICD-10-CM

## 2011-02-12 MED ORDER — OMEPRAZOLE 40 MG PO CPDR: 40.0000 mg | DELAYED_RELEASE_CAPSULE | Freq: Every day | ORAL | Status: DC

## 2011-02-12 NOTE — Patient Instructions (Signed)
We have renewed your Omeprazole.

## 2011-02-12 NOTE — Progress Notes (Signed)
This is a fairly pleasant 77 year old Caucasian female with well controlled acid reflux on omeprazole 40 mg a day. She also has mild constipation responsive to when necessary MiraLax. She recently had mildly abnormal liver enzymes, and upper abdominal ultrasound exam was consistent with mild fatty infiltration of the liver. She has no hepatobiliary complaints. Hepatitis serologies and metabolic and autoimmune parameters were all negative. She does have a history of hypertension and is managed by Dr. Buren Kos in internal medicine. There is no family history of liver disease, and the patient denies alcohol abuse or previous history of hepatitis or pancreatitis. Her appetite is good her weight is stable and she denies any systemic complaints.  Current Medications, Allergies, Past Medical History, Past Surgical History, Family History and Social History were reviewed in [employer] record.  Pertinent Review of Systems Negative   Physical Exam: No stigmata of chronic liver disease noted. Abdominal exam shows no organomegaly, masses, tenderness, or ascites. Mental status is normal. 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    Assessment and Plan: She is to continue her omeprazole for her chronic GERD. The patient is up-to-date on her endoscopy and colonoscopy exams. She has mild fatty infiltration of her liver with only very minimal increase in ALT level. I have recommended that she have her liver test done every year, but I do not think further workup indicated at this time. No diagnosis found.

## 2011-02-25 ENCOUNTER — Other Ambulatory Visit: Payer: Self-pay | Admitting: Internal Medicine

## 2011-02-25 DIAGNOSIS — Z1231 Encounter for screening mammogram for malignant neoplasm of breast: Secondary | ICD-10-CM

## 2011-04-09 ENCOUNTER — Ambulatory Visit: Payer: Medicare Other

## 2011-04-10 ENCOUNTER — Ambulatory Visit
Admission: RE | Admit: 2011-04-10 | Discharge: 2011-04-10 | Disposition: A | Discharge status: Home / Self Care | Payer: Medicare Other | Source: Ambulatory Visit | Attending: Internal Medicine | Admitting: Internal Medicine

## 2011-04-10 DIAGNOSIS — Z1231 Encounter for screening mammogram for malignant neoplasm of breast: Secondary | ICD-10-CM

## 2011-04-11 LAB — CBC
HCT: 23.7 — ABNORMAL LOW
HCT: 27.2 — ABNORMAL LOW
HCT: 43.5
Hemoglobin: 11.2 — ABNORMAL LOW
Hemoglobin: 14.9
Hemoglobin: 8.2 — ABNORMAL LOW
MCHC: 34.3
MCHC: 34.3
MCHC: 34.7
MCHC: 35.5
MCV: 89.1
MCV: 90.7
MCV: 91.3
Platelets: 177
RBC: 3.54 — ABNORMAL LOW
RBC: 4.79
RDW: 13.1
RDW: 13.2
WBC: 10.7 — ABNORMAL HIGH

## 2011-04-11 LAB — BASIC METABOLIC PANEL
BUN: 10
BUN: 7
CO2: 25
CO2: 28
CO2: 29
CO2: 29
Calcium: 7.8 — ABNORMAL LOW
Chloride: 101
Chloride: 103
Chloride: 104
GFR calc Af Amer: >60
GFR calc Af Amer: >60
Glucose, Bld: 108 — ABNORMAL HIGH
Glucose, Bld: 136 — ABNORMAL HIGH
Glucose, Bld: 90
Potassium: 3.5
Potassium: 3.6
Potassium: 4
Sodium: 138
Sodium: 138
Sodium: 140

## 2011-04-11 LAB — PROTIME-INR
INR: 1.1
INR: 2.2 — ABNORMAL HIGH

## 2011-04-11 LAB — CROSSMATCH

## 2011-04-12 LAB — PROTIME-INR
INR: 2.4 — ABNORMAL HIGH
Prothrombin Time: 27.4 — ABNORMAL HIGH

## 2011-07-29 ENCOUNTER — Telehealth: Payer: Self-pay | Admitting: *Deleted

## 2011-07-29 DIAGNOSIS — R945 Abnormal results of liver function studies: Secondary | ICD-10-CM

## 2011-07-29 NOTE — Telephone Encounter (Signed)
Message copied by Leonette Monarch on Mon Jul 29, 2011  8:45 AM ------      Message from: Harlow Mares D      Created: Mon Feb 04, 2011  1:46 PM       Call pt to remind her she is due for lfts, order already in

## 2011-07-29 NOTE — Telephone Encounter (Signed)
Pt will come for labs on 08/12/2011, she is sick right now.

## 2011-08-12 ENCOUNTER — Other Ambulatory Visit (INDEPENDENT_AMBULATORY_CARE_PROVIDER_SITE_OTHER): Payer: Medicare Other

## 2011-08-12 DIAGNOSIS — R7401 Elevation of levels of liver transaminase levels: Secondary | ICD-10-CM

## 2011-08-12 LAB — HEPATIC FUNCTION PANEL
Albumin: 4.4 g/dL (ref 3.5–5.2)
Alkaline Phosphatase: 61 U/L (ref 39–117)
Bilirubin, Direct: 0.1 mg/dL (ref 0.0–0.3)

## 2011-08-19 ENCOUNTER — Telehealth: Payer: Self-pay | Admitting: *Deleted

## 2011-08-19 NOTE — Telephone Encounter (Signed)
Pt aware.

## 2011-08-19 NOTE — Telephone Encounter (Signed)
Message copied by Leonette Monarch on Mon Aug 19, 2011  1:33 PM ------      Message from: Jarold Motto, DAVID R      Created: Mon Aug 19, 2011 12:30 PM       Repeat 1 y

## 2011-08-19 NOTE — Telephone Encounter (Signed)
Left message to call back, staff message sent to come back in one year for repeat lfts

## 2011-09-30 ENCOUNTER — Other Ambulatory Visit (HOSPITAL_COMMUNITY): Payer: Self-pay | Admitting: Orthopaedic Surgery

## 2011-11-20 ENCOUNTER — Encounter (HOSPITAL_COMMUNITY): Payer: Self-pay | Admitting: Pharmacy Technician

## 2011-11-25 ENCOUNTER — Encounter (HOSPITAL_COMMUNITY): Payer: Self-pay

## 2011-11-25 ENCOUNTER — Encounter (HOSPITAL_COMMUNITY)
Admission: RE | Admit: 2011-11-25 | Discharge: 2011-11-25 | Disposition: A | Discharge status: Home / Self Care | Payer: Medicare Other | Source: Ambulatory Visit | Attending: Orthopaedic Surgery | Admitting: Orthopaedic Surgery

## 2011-11-25 ENCOUNTER — Ambulatory Visit (HOSPITAL_COMMUNITY)
Admission: RE | Admit: 2011-11-25 | Discharge: 2011-11-25 | Disposition: A | Discharge status: Home / Self Care | Payer: Medicare Other | Source: Ambulatory Visit | Attending: Orthopaedic Surgery | Admitting: Orthopaedic Surgery

## 2011-11-25 DIAGNOSIS — M1610 Unilateral primary osteoarthritis, unspecified hip: Secondary | ICD-10-CM | POA: Insufficient documentation

## 2011-11-25 DIAGNOSIS — Z01818 Encounter for other preprocedural examination: Secondary | ICD-10-CM | POA: Insufficient documentation

## 2011-11-25 DIAGNOSIS — M169 Osteoarthritis of hip, unspecified: Secondary | ICD-10-CM | POA: Insufficient documentation

## 2011-11-25 DIAGNOSIS — Z01812 Encounter for preprocedural laboratory examination: Secondary | ICD-10-CM | POA: Insufficient documentation

## 2011-11-25 HISTORY — DX: Unspecified osteoarthritis, unspecified site: M19.90

## 2011-11-25 LAB — CBC
MCV: 89.8 fL (ref 78.0–100.0)
Platelets: 203 10*3/uL (ref 150–400)
RDW: 12.5 % (ref 11.5–15.5)
WBC: 6.1 10*3/uL (ref 4.0–10.5)

## 2011-11-25 LAB — URINALYSIS, ROUTINE W REFLEX MICROSCOPIC
Bilirubin Urine: NEGATIVE
Nitrite: NEGATIVE
Specific Gravity, Urine: 1.01 (ref 1.005–1.030)
Urobilinogen, UA: 0.2 mg/dL (ref 0.0–1.0)

## 2011-11-25 LAB — BASIC METABOLIC PANEL
Calcium: 9.4 mg/dL (ref 8.4–10.5)
Creatinine, Ser: 0.57 mg/dL (ref 0.50–1.10)
GFR calc Af Amer: >90 mL/min (ref >90)
GFR calc non Af Amer: 88 mL/min — ABNORMAL LOW (ref >90)

## 2011-11-25 LAB — PROTIME-INR
INR: 0.99 (ref 0.00–1.49)
Prothrombin Time: 13.3 seconds (ref 11.6–15.2)

## 2011-11-25 LAB — URINE MICROSCOPIC-ADD ON

## 2011-11-25 NOTE — Pre-Procedure Instructions (Signed)
EKG REPORT FROM DR. SHAW - DONE 11/04/11 ON PT'S CHART. CXR, CBC, BMET, PT, PTT, UA, T/S WERE DONE TODAY AT [facility] PREOP.

## 2011-11-25 NOTE — Patient Instructions (Addendum)
YOUR SURGERY IS SCHEDULED ON:  Friday  5/17  AT 7:30 AM  REPORT TO [facility] AT:  5:30 AM      PHONE # FOR SHORT STAY IS [phone]  DO NOT EAT OR DRINK ANYTHING AFTER MIDNIGHT THE NIGHT BEFORE YOUR SURGERY.  YOU MAY BRUSH YOUR TEETH, RINSE OUT YOUR MOUTH--BUT NO WATER, NO FOOD, NO CHEWING GUM, NO MINTS, NO CANDIES, NO CHEWING TOBACCO.  PLEASE TAKE THE FOLLOWING MEDICATIONS THE AM OF YOUR SURGERY WITH A FEW SIPS OF WATER:  ALPRAZOLAM, NORVASC, SYNTHROID, OMEPRAZOLE AND BYSTOLIC    IF YOU USE INHALERS--USE YOUR INHALERS THE AM OF YOUR SURGERY AND BRING INHALERS TO THE HOSPITAL -TAKE TO SURGERY.    IF YOU ARE DIABETIC:  DO NOT TAKE ANY DIABETIC MEDICATIONS THE AM OF YOUR SURGERY.  IF YOU TAKE INSULIN IN THE EVENINGS--PLEASE ONLY TAKE 1/2 NORMAL EVENING DOSE THE NIGHT BEFORE YOUR SURGERY.  NO INSULIN THE AM OF YOUR SURGERY.  IF YOU HAVE SLEEP APNEA AND USE CPAP OR BIPAP--PLEASE BRING THE MASK --NOT THE MACHINE-NOT THE TUBING   -JUST THE MASK. DO NOT BRING VALUABLES, MONEY, CREDIT CARDS.  CONTACT LENS, DENTURES / PARTIALS, GLASSES SHOULD NOT BE WORN TO SURGERY AND IN MOST CASES-HEARING AIDS WILL NEED TO BE REMOVED.  BRING YOUR GLASSES CASE, ANY EQUIPMENT NEEDED FOR YOUR CONTACT LENS. FOR PATIENTS ADMITTED TO THE HOSPITAL--CHECK OUT TIME THE DAY OF DISCHARGE IS 11:00 AM.  ALL INPATIENT ROOMS ARE PRIVATE - WITH BATHROOM, TELEPHONE, TELEVISION AND WIFI INTERNET. IF YOU ARE BEING DISCHARGED THE SAME DAY OF YOUR SURGERY--YOU CAN NOT DRIVE YOURSELF HOME--AND SHOULD NOT GO HOME ALONE BY TAXI OR BUS.  NO DRIVING OR OPERATING MACHINERY FOR 24 HOURS FOLLOWING ANESTHESIA / PAIN MEDICATIONS.                            SPECIAL INSTRUCTIONS:  CHLORHEXIDINE SOAP SHOWER (other brand names are Betasept and Hibiclens ) PLEASE SHOWER WITH CHLORHEXIDINE THE NIGHT BEFORE YOUR SURGERY AND THE AM OF YOUR SURGERY. DO NOT USE CHLORHEXIDINE ON YOUR FACE OR PRIVATE AREAS--YOU MAY USE YOUR NORMAL SOAP  THOSE AREAS AND YOUR NORMAL SHAMPOO.  WOMEN SHOULD AVOID SHAVING UNDER ARMS AND SHAVING LEGS 48 HOURS BEFORE USING CHLORHEXIDINE TO AVOID SKIN IRRITATION.  DO NOT USE IF ALLERGIC TO CHLORHEXIDINE.  PLEASE READ OVER ANY  FACT SHEETS THAT YOU WERE GIVEN: MRSA INFORMATION, BLOOD TRANSFUSION INFORMATION.

## 2011-11-29 ENCOUNTER — Ambulatory Visit (HOSPITAL_COMMUNITY): Payer: Medicare Other

## 2011-11-29 ENCOUNTER — Encounter (HOSPITAL_COMMUNITY)
Admission: RE | Disposition: A | Discharge status: Home Health | Payer: Self-pay | Source: Ambulatory Visit | Attending: Orthopaedic Surgery

## 2011-11-29 ENCOUNTER — Encounter (HOSPITAL_COMMUNITY): Payer: Self-pay | Admitting: *Deleted

## 2011-11-29 ENCOUNTER — Ambulatory Visit (HOSPITAL_COMMUNITY): Payer: Medicare Other | Admitting: Anesthesiology

## 2011-11-29 ENCOUNTER — Encounter (HOSPITAL_COMMUNITY): Payer: Self-pay | Admitting: Anesthesiology

## 2011-11-29 ENCOUNTER — Inpatient Hospital Stay (HOSPITAL_COMMUNITY)
Admission: RE | Admit: 2011-11-29 | Discharge: 2011-12-02 | DRG: 470 | Disposition: A | Discharge status: Home Health | Payer: Medicare Other | Source: Ambulatory Visit | Attending: Orthopaedic Surgery | Admitting: Orthopaedic Surgery

## 2011-11-29 DIAGNOSIS — M169 Osteoarthritis of hip, unspecified: Secondary | ICD-10-CM

## 2011-11-29 DIAGNOSIS — D62 Acute posthemorrhagic anemia: Secondary | ICD-10-CM | POA: Diagnosis not present

## 2011-11-29 DIAGNOSIS — M1610 Unilateral primary osteoarthritis, unspecified hip: Principal | ICD-10-CM | POA: Diagnosis present

## 2011-11-29 DIAGNOSIS — I10 Essential (primary) hypertension: Secondary | ICD-10-CM | POA: Diagnosis present

## 2011-11-29 DIAGNOSIS — F411 Generalized anxiety disorder: Secondary | ICD-10-CM | POA: Diagnosis present

## 2011-11-29 DIAGNOSIS — E039 Hypothyroidism, unspecified: Secondary | ICD-10-CM | POA: Diagnosis present

## 2011-11-29 DIAGNOSIS — M810 Age-related osteoporosis without current pathological fracture: Secondary | ICD-10-CM | POA: Diagnosis present

## 2011-11-29 DIAGNOSIS — K219 Gastro-esophageal reflux disease without esophagitis: Secondary | ICD-10-CM | POA: Diagnosis present

## 2011-11-29 HISTORY — PX: TOTAL HIP ARTHROPLASTY: SHX124

## 2011-11-29 LAB — TYPE AND SCREEN
ABO/RH(D): A POS
Antibody Screen: NEGATIVE

## 2011-11-29 SURGERY — ARTHROPLASTY, HIP, TOTAL, ANTERIOR APPROACH
Anesthesia: General | Site: Hip | Laterality: Left | Wound class: Clean

## 2011-11-29 MED ORDER — ZOLPIDEM TARTRATE 5 MG PO TABS: 5.0000 mg | ORAL_TABLET | Freq: Every evening | ORAL | Status: DC | PRN

## 2011-11-29 MED ORDER — HYDROMORPHONE HCL PF 1 MG/ML IJ SOLN
INTRAMUSCULAR | Status: AC
  Filled 2011-11-29: qty 1

## 2011-11-29 MED ORDER — SUCCINYLCHOLINE CHLORIDE 20 MG/ML IJ SOLN
INTRAMUSCULAR | Status: DC | PRN
  Administered 2011-11-29: 100 mg via INTRAVENOUS

## 2011-11-29 MED ORDER — METHOCARBAMOL 500 MG PO TABS
500.0000 mg | ORAL_TABLET | Freq: Four times a day (QID) | ORAL | Status: DC | PRN
  Administered 2011-12-01: 500 mg via ORAL
  Filled 2011-11-29: qty 1

## 2011-11-29 MED ORDER — SODIUM CHLORIDE 0.9 % IV SOLN
INTRAVENOUS | Status: DC
Start: 2011-11-29 — End: 2011-12-01
  Administered 2011-11-29: 23:00:00 via INTRAVENOUS
  Administered 2011-11-29: 1000 mL via INTRAVENOUS

## 2011-11-29 MED ORDER — ACETAMINOPHEN 10 MG/ML IV SOLN
INTRAVENOUS | Status: DC | PRN
  Administered 2011-11-29: 1000 mg via INTRAVENOUS

## 2011-11-29 MED ORDER — ONDANSETRON HCL 4 MG PO TABS: 4.0000 mg | ORAL_TABLET | Freq: Four times a day (QID) | ORAL | Status: DC | PRN

## 2011-11-29 MED ORDER — VITAMIN D3 25 MCG (1000 UNIT) PO TABS
1000.0000 [IU] | ORAL_TABLET | Freq: Every day | ORAL | Status: DC
  Administered 2011-11-30 – 2011-12-02 (×3): 1000 [IU] via ORAL
  Filled 2011-11-29 (×4): qty 1

## 2011-11-29 MED ORDER — ONDANSETRON HCL 4 MG/2ML IJ SOLN
4.0000 mg | Freq: Four times a day (QID) | INTRAMUSCULAR | Status: DC | PRN
  Administered 2011-11-29: 4 mg via INTRAVENOUS
  Filled 2011-11-29: qty 2

## 2011-11-29 MED ORDER — PROMETHAZINE HCL 25 MG/ML IJ SOLN: 6.2500 mg | INTRAMUSCULAR | Status: DC | PRN

## 2011-11-29 MED ORDER — SODIUM CHLORIDE 0.9 % IJ SOLN: 9.0000 mL | INTRAMUSCULAR | Status: DC | PRN

## 2011-11-29 MED ORDER — DOCUSATE SODIUM 100 MG PO CAPS
100.0000 mg | ORAL_CAPSULE | Freq: Two times a day (BID) | ORAL | Status: DC
  Administered 2011-11-29 – 2011-12-02 (×7): 100 mg via ORAL

## 2011-11-29 MED ORDER — MENTHOL 3 MG MT LOZG
1.0000 | LOZENGE | OROMUCOSAL | Status: DC | PRN
  Filled 2011-11-29: qty 9

## 2011-11-29 MED ORDER — NALOXONE HCL 0.4 MG/ML IJ SOLN: 0.4000 mg | INTRAMUSCULAR | Status: DC | PRN

## 2011-11-29 MED ORDER — LACTATED RINGERS IV SOLN: INTRAVENOUS | Status: DC

## 2011-11-29 MED ORDER — LIDOCAINE HCL (CARDIAC) 20 MG/ML IV SOLN
INTRAVENOUS | Status: DC | PRN
  Administered 2011-11-29: 20 mg via INTRAVENOUS

## 2011-11-29 MED ORDER — HYDROMORPHONE HCL PF 1 MG/ML IJ SOLN
0.2500 mg | INTRAMUSCULAR | Status: DC | PRN
  Administered 2011-11-29 (×2): 0.5 mg via INTRAVENOUS
  Administered 2011-11-29: 0.25 mg via INTRAVENOUS

## 2011-11-29 MED ORDER — ACETAMINOPHEN 650 MG RE SUPP: 650.0000 mg | Freq: Four times a day (QID) | RECTAL | Status: DC | PRN

## 2011-11-29 MED ORDER — ALUM & MAG HYDROXIDE-SIMETH 200-200-20 MG/5ML PO SUSP: 30.0000 mL | ORAL | Status: DC | PRN

## 2011-11-29 MED ORDER — MIDAZOLAM HCL 5 MG/5ML IJ SOLN
INTRAMUSCULAR | Status: DC | PRN
  Administered 2011-11-29: 1 mg via INTRAVENOUS

## 2011-11-29 MED ORDER — DIPHENHYDRAMINE HCL 12.5 MG/5ML PO ELIX: 12.5000 mg | ORAL_SOLUTION | ORAL | Status: DC | PRN

## 2011-11-29 MED ORDER — HYDROCODONE-ACETAMINOPHEN 5-325 MG PO TABS
1.0000 | ORAL_TABLET | ORAL | Status: DC | PRN
  Administered 2011-12-02: 1 via ORAL
  Filled 2011-11-29: qty 1
  Filled 2011-11-29: qty 2

## 2011-11-29 MED ORDER — ACETAMINOPHEN 10 MG/ML IV SOLN
INTRAVENOUS | Status: AC
  Filled 2011-11-29: qty 100

## 2011-11-29 MED ORDER — EPHEDRINE SULFATE 50 MG/ML IJ SOLN
INTRAMUSCULAR | Status: DC | PRN
  Administered 2011-11-29: 10 mg via INTRAVENOUS

## 2011-11-29 MED ORDER — METOCLOPRAMIDE HCL 10 MG PO TABS
5.0000 mg | ORAL_TABLET | Freq: Three times a day (TID) | ORAL | Status: DC | PRN
  Administered 2011-11-29: 10 mg via ORAL
  Filled 2011-11-29: qty 1

## 2011-11-29 MED ORDER — 0.9 % SODIUM CHLORIDE (POUR BTL) OPTIME
TOPICAL | Status: DC | PRN
  Administered 2011-11-29: 1000 mL

## 2011-11-29 MED ORDER — LOSARTAN POTASSIUM 50 MG PO TABS
100.0000 mg | ORAL_TABLET | Freq: Every day | ORAL | Status: DC
  Administered 2011-11-30 – 2011-12-02 (×3): 100 mg via ORAL
  Filled 2011-11-29 (×4): qty 2

## 2011-11-29 MED ORDER — VITAMIN D3 25 MCG (1000 UT) PO CAPS: 1.0000 | ORAL_CAPSULE | Freq: Every day | ORAL | Status: DC

## 2011-11-29 MED ORDER — HYDROMORPHONE HCL PF 1 MG/ML IJ SOLN
INTRAMUSCULAR | Status: DC | PRN
  Administered 2011-11-29 (×4): 0.5 mg via INTRAVENOUS

## 2011-11-29 MED ORDER — DIPHENHYDRAMINE HCL 50 MG/ML IJ SOLN: 12.5000 mg | Freq: Four times a day (QID) | INTRAMUSCULAR | Status: DC | PRN

## 2011-11-29 MED ORDER — PROPOFOL 10 MG/ML IV EMUL
INTRAVENOUS | Status: DC | PRN
  Administered 2011-11-29: 140 mg via INTRAVENOUS

## 2011-11-29 MED ORDER — CALCIUM CARBONATE-VITAMIN D 500-200 MG-UNIT PO TABS
1.0000 | ORAL_TABLET | Freq: Two times a day (BID) | ORAL | Status: DC
  Administered 2011-11-29 – 2011-12-02 (×7): 1 via ORAL
  Filled 2011-11-29 (×8): qty 1

## 2011-11-29 MED ORDER — NEOSTIGMINE METHYLSULFATE 1 MG/ML IJ SOLN
INTRAMUSCULAR | Status: DC | PRN
  Administered 2011-11-29: 3 mg via INTRAVENOUS

## 2011-11-29 MED ORDER — ALPRAZOLAM 0.5 MG PO TABS
0.5000 mg | ORAL_TABLET | Freq: Two times a day (BID) | ORAL | Status: DC | PRN
  Administered 2011-11-29 – 2011-12-01 (×3): 0.5 mg via ORAL
  Filled 2011-11-29 (×3): qty 1

## 2011-11-29 MED ORDER — ADULT MULTIVITAMIN W/MINERALS CH
1.0000 | ORAL_TABLET | Freq: Every day | ORAL | Status: DC
  Administered 2011-11-29 – 2011-12-02 (×4): 1 via ORAL
  Filled 2011-11-29 (×4): qty 1

## 2011-11-29 MED ORDER — MULTIVITAMINS PO CAPS: 1.0000 | ORAL_CAPSULE | Freq: Every day | ORAL | Status: DC

## 2011-11-29 MED ORDER — RIVAROXABAN 10 MG PO TABS
10.0000 mg | ORAL_TABLET | Freq: Every day | ORAL | Status: DC
  Administered 2011-11-30 – 2011-12-02 (×3): 10 mg via ORAL
  Filled 2011-11-29 (×4): qty 1

## 2011-11-29 MED ORDER — METHOCARBAMOL 100 MG/ML IJ SOLN
500.0000 mg | Freq: Four times a day (QID) | INTRAVENOUS | Status: DC | PRN
  Administered 2011-11-29: 500 mg via INTRAVENOUS
  Filled 2011-11-29: qty 5

## 2011-11-29 MED ORDER — PHENOL 1.4 % MT LIQD
1.0000 | OROMUCOSAL | Status: DC | PRN
  Filled 2011-11-29: qty 177

## 2011-11-29 MED ORDER — FENTANYL CITRATE 0.05 MG/ML IJ SOLN
INTRAMUSCULAR | Status: DC | PRN
  Administered 2011-11-29: 25 ug via INTRAVENOUS
  Administered 2011-11-29: 75 ug via INTRAVENOUS

## 2011-11-29 MED ORDER — MORPHINE SULFATE (PF) 1 MG/ML IV SOLN
INTRAVENOUS | Status: AC
  Filled 2011-11-29: qty 25

## 2011-11-29 MED ORDER — MEPERIDINE HCL 25 MG/ML IJ SOLN: 25.0000 mg | Freq: Once | INTRAMUSCULAR | Status: DC

## 2011-11-29 MED ORDER — ONDANSETRON HCL 4 MG/2ML IJ SOLN
INTRAMUSCULAR | Status: DC | PRN
  Administered 2011-11-29 (×2): 2 mg via INTRAVENOUS

## 2011-11-29 MED ORDER — ONDANSETRON HCL 4 MG/2ML IJ SOLN: 4.0000 mg | Freq: Four times a day (QID) | INTRAMUSCULAR | Status: DC | PRN

## 2011-11-29 MED ORDER — LEVOTHYROXINE SODIUM 50 MCG PO TABS
50.0000 ug | ORAL_TABLET | Freq: Every day | ORAL | Status: DC
  Administered 2011-11-30 – 2011-12-02 (×3): 50 ug via ORAL
  Filled 2011-11-29 (×4): qty 1

## 2011-11-29 MED ORDER — LACTATED RINGERS IV SOLN
INTRAVENOUS | Status: DC | PRN
  Administered 2011-11-29 (×2): via INTRAVENOUS

## 2011-11-29 MED ORDER — LOSARTAN POTASSIUM-HCTZ 100-25 MG PO TABS: 1.0000 | ORAL_TABLET | Freq: Every day | ORAL | Status: DC

## 2011-11-29 MED ORDER — ESTRADIOL 0.1 MG/GM VA CREA
2.0000 g | TOPICAL_CREAM | VAGINAL | Status: DC
  Filled 2011-11-29: qty 42.5

## 2011-11-29 MED ORDER — MORPHINE SULFATE 2 MG/ML IJ SOLN: 1.0000 mg | INTRAMUSCULAR | Status: DC | PRN

## 2011-11-29 MED ORDER — GLYCOPYRROLATE 0.2 MG/ML IJ SOLN
INTRAMUSCULAR | Status: DC | PRN
  Administered 2011-11-29: 0.4 mg via INTRAVENOUS
  Administered 2011-11-29: 0.3 mg via INTRAVENOUS

## 2011-11-29 MED ORDER — OXYCODONE HCL 5 MG PO TABS
5.0000 mg | ORAL_TABLET | ORAL | Status: DC | PRN
  Administered 2011-12-01: 10 mg via ORAL
  Filled 2011-11-29: qty 2

## 2011-11-29 MED ORDER — PANTOPRAZOLE SODIUM 40 MG PO TBEC
40.0000 mg | DELAYED_RELEASE_TABLET | Freq: Every day | ORAL | Status: DC
  Administered 2011-11-29 – 2011-12-02 (×4): 40 mg via ORAL
  Filled 2011-11-29 (×4): qty 1

## 2011-11-29 MED ORDER — ACETAMINOPHEN 325 MG PO TABS: 650.0000 mg | ORAL_TABLET | Freq: Four times a day (QID) | ORAL | Status: DC | PRN

## 2011-11-29 MED ORDER — CLINDAMYCIN PHOSPHATE 600 MG/50ML IV SOLN
600.0000 mg | INTRAVENOUS | Status: AC
  Administered 2011-11-29: 600 mg via INTRAVENOUS

## 2011-11-29 MED ORDER — CLINDAMYCIN PHOSPHATE 600 MG/50ML IV SOLN
INTRAVENOUS | Status: AC
  Filled 2011-11-29: qty 50

## 2011-11-29 MED ORDER — MORPHINE SULFATE (PF) 1 MG/ML IV SOLN
INTRAVENOUS | Status: DC
  Administered 2011-11-29: 1 mg via INTRAVENOUS
  Administered 2011-11-30: 8 mg via INTRAVENOUS
  Administered 2011-11-30 – 2011-12-01 (×3): 1 mg via INTRAVENOUS
  Administered 2011-12-01: 3 mg via INTRAVENOUS

## 2011-11-29 MED ORDER — CALCIUM CARBONATE-VITAMIN D 600-400 MG-UNIT PO TABS: 1.0000 | ORAL_TABLET | Freq: Two times a day (BID) | ORAL | Status: DC

## 2011-11-29 MED ORDER — HETASTARCH-ELECTROLYTES 6 % IV SOLN
INTRAVENOUS | Status: DC | PRN
  Administered 2011-11-29: 09:00:00 via INTRAVENOUS

## 2011-11-29 MED ORDER — DIPHENHYDRAMINE HCL 12.5 MG/5ML PO ELIX: 12.5000 mg | ORAL_SOLUTION | Freq: Four times a day (QID) | ORAL | Status: DC | PRN

## 2011-11-29 MED ORDER — MEPERIDINE HCL 50 MG/ML IJ SOLN: 6.2500 mg | INTRAMUSCULAR | Status: DC | PRN

## 2011-11-29 MED ORDER — NEBIVOLOL HCL 5 MG PO TABS
5.0000 mg | ORAL_TABLET | Freq: Every day | ORAL | Status: DC
  Administered 2011-11-30 – 2011-12-02 (×3): 5 mg via ORAL
  Filled 2011-11-29 (×4): qty 1

## 2011-11-29 MED ORDER — CISATRACURIUM BESYLATE (PF) 10 MG/5ML IV SOLN
INTRAVENOUS | Status: DC | PRN
  Administered 2011-11-29: 5 mg via INTRAVENOUS
  Administered 2011-11-29: 3 mg via INTRAVENOUS

## 2011-11-29 MED ORDER — METOCLOPRAMIDE HCL 5 MG/ML IJ SOLN: 5.0000 mg | Freq: Three times a day (TID) | INTRAMUSCULAR | Status: DC | PRN

## 2011-11-29 MED ORDER — HYDROCHLOROTHIAZIDE 25 MG PO TABS
25.0000 mg | ORAL_TABLET | Freq: Every day | ORAL | Status: DC
  Administered 2011-11-30 – 2011-12-02 (×3): 25 mg via ORAL
  Filled 2011-11-29 (×4): qty 1

## 2011-11-29 MED ORDER — AMLODIPINE BESYLATE 5 MG PO TABS
5.0000 mg | ORAL_TABLET | Freq: Every day | ORAL | Status: DC
  Administered 2011-11-30 – 2011-12-02 (×3): 5 mg via ORAL
  Filled 2011-11-29 (×5): qty 1

## 2011-11-29 MED ORDER — BLISTEX EX OINT
TOPICAL_OINTMENT | CUTANEOUS | Status: AC
  Administered 2011-11-29: 1
  Filled 2011-11-29: qty 10

## 2011-11-29 SURGICAL SUPPLY — 37 items
BAG SPEC THK2 15X12 ZIP CLS (MISCELLANEOUS) ×1
BAG ZIPLOCK 12X15 (MISCELLANEOUS) ×3 IMPLANT
BLADE SAW SGTL 18X1.27X75 (BLADE) ×2 IMPLANT
CELLS DAT CNTRL 66122 CELL SVR (MISCELLANEOUS) ×1 IMPLANT
CLOTH BEACON ORANGE TIMEOUT ST (SAFETY) ×2 IMPLANT
DRAPE C-ARM 42X72 X-RAY (DRAPES) ×2 IMPLANT
DRAPE STERI IOBAN 125X83 (DRAPES) ×2 IMPLANT
DRAPE U-SHAPE 47X51 STRL (DRAPES) ×6 IMPLANT
DRSG MEPILEX BORDER 4X8 (GAUZE/BANDAGES/DRESSINGS) ×2 IMPLANT
DURAPREP 26ML APPLICATOR (WOUND CARE) ×2 IMPLANT
ELECT BLADE TIP CTD 4 INCH (ELECTRODE) ×2 IMPLANT
ELECT REM PT RETURN 9FT ADLT (ELECTROSURGICAL) ×2
ELECTRODE REM PT RTRN 9FT ADLT (ELECTROSURGICAL) ×1 IMPLANT
FACESHIELD LNG OPTICON STERILE (SAFETY) ×6 IMPLANT
GAUZE XEROFORM 1X8 LF (GAUZE/BANDAGES/DRESSINGS) ×2 IMPLANT
GLOVE BIO SURGEON STRL SZ7 (GLOVE) ×2 IMPLANT
GLOVE BIO SURGEON STRL SZ7.5 (GLOVE) ×2 IMPLANT
GLOVE BIOGEL PI IND STRL 7.5 (GLOVE) IMPLANT
GLOVE BIOGEL PI IND STRL 8 (GLOVE) ×1 IMPLANT
GLOVE BIOGEL PI INDICATOR 7.5 (GLOVE)
GLOVE BIOGEL PI INDICATOR 8 (GLOVE) ×1
GLOVE ECLIPSE 7.0 STRL STRAW (GLOVE) ×1 IMPLANT
GOWN STRL REIN XL XLG (GOWN DISPOSABLE) ×4 IMPLANT
KIT BASIN OR (CUSTOM PROCEDURE TRAY) ×2 IMPLANT
PACK TOTAL JOINT (CUSTOM PROCEDURE TRAY) ×2 IMPLANT
PADDING CAST COTTON 6X4 STRL (CAST SUPPLIES) ×2 IMPLANT
RETRACTOR WND ALEXIS 18 MED (MISCELLANEOUS) ×1 IMPLANT
RTRCTR WOUND ALEXIS 18CM MED (MISCELLANEOUS) ×2
STAPLER VISISTAT 35W (STAPLE) IMPLANT
SUT ETHIBOND NAB CT1 #1 30IN (SUTURE) ×3 IMPLANT
SUT VIC AB 1 CT1 36 (SUTURE) ×2 IMPLANT
SUT VIC AB 2-0 CT1 27 (SUTURE) ×4
SUT VIC AB 2-0 CT1 TAPERPNT 27 (SUTURE) ×2 IMPLANT
SUT VLOC 180 0 24IN GS25 (SUTURE) ×1 IMPLANT
TOWEL OR 17X26 10 PK STRL BLUE (TOWEL DISPOSABLE) ×4 IMPLANT
TOWEL OR NON WOVEN STRL DISP B (DISPOSABLE) ×2 IMPLANT
TRAY FOLEY CATH 14FRSI W/METER (CATHETERS) ×2 IMPLANT

## 2011-11-29 NOTE — Brief Op Note (Signed)
11/29/2011  9:12 AM  PATIENT:  Julie [name]  78 y.o. female  PRE-OPERATIVE DIAGNOSIS:  left hip severe osteoarthritis  POST-OPERATIVE DIAGNOSIS:  left hip severe osteoarthritis  PROCEDURE:  Procedure(s) (LRB): TOTAL HIP ARTHROPLASTY ANTERIOR APPROACH (Left)  SURGEON:  Surgeon(s) and Role:    * Kathryne Hitch, MD - Primary  PHYSICIAN ASSISTANT:   ASSISTANTS: none   ANESTHESIA:   general  EBL:  Total I/O In: 1000 [I.V.:1000] Out: 600 [Urine:400; Blood:200]  BLOOD ADMINISTERED:none  DRAINS: none   LOCAL MEDICATIONS USED:  NONE  SPECIMEN:  No Specimen  DISPOSITION OF SPECIMEN:  N/A  COUNTS:  YES  TOURNIQUET:  * No tourniquets in log *  DICTATION: .Other Dictation: Dictation Number [id number]  PLAN OF CARE: Admit to inpatient   PATIENT DISPOSITION:  PACU - hemodynamically stable.   Delay start of Pharmacological VTE agent (>24hrs) due to surgical blood loss or risk of bleeding: no

## 2011-11-29 NOTE — Transfer of Care (Signed)
Immediate Anesthesia Transfer of Care Note  Patient: Julie [name]  Procedure(s) Performed: Procedure(s) (LRB): TOTAL HIP ARTHROPLASTY ANTERIOR APPROACH (Left)  Patient Location: PACU  Anesthesia Type: General  Level of Consciousness: awake and sedated  Airway & Oxygen Therapy: Patient Spontanous Breathing and Patient connected to face mask oxygen  Post-op Assessment: Report given to PACU RN and Post -op Vital signs reviewed and stable  Post vital signs: Reviewed and stable  Complications: No apparent anesthesia complications

## 2011-11-29 NOTE — Anesthesia Postprocedure Evaluation (Signed)
  Anesthesia Post-op Note  Patient: Julie [name]  Procedure(s) Performed: Procedure(s) (LRB): TOTAL HIP ARTHROPLASTY ANTERIOR APPROACH (Left)  Patient Location: PACU  Anesthesia Type: General  Level of Consciousness: awake and alert   Airway and Oxygen Therapy: Patient Spontanous Breathing  Post-op Pain: mild  Post-op Assessment: Post-op Vital signs reviewed, Patient's Cardiovascular Status Stable, Respiratory Function Stable, Patent Airway and No signs of Nausea or vomiting  Post-op Vital Signs: stable  Complications: No apparent anesthesia complications  

## 2011-11-29 NOTE — H&P (Signed)
Julie [name] is an 78 y.o. female.   Chief Complaint:   Severe left hip pain HPI:   78 yo active female with severe left hip pain and known OA.  Xrays show bone-on-bone wear.  She has failed conservative treatment and wishes to proceed with a left total hip replacement given her daily pain and decreased mobility and quality of life.  The risks are blood loss, nerve injury, fracture, DVT and PE.  The goals are decreased pain and improved mobility.  Past Medical History  Diagnosis Date  . Anxiety   . Hyperlipidemia   . Hypertension   . Hypothyroidism   . Osteoporosis   . GERD (gastroesophageal reflux disease)   . Gastritis   . Diverticulosis   . Hemorrhoids   . Arthritis     SEVERE OA LEFT HIP  - S/P RIGHT HIP REPLACEMENT / OCCASIONAL BACK AND NECK PAIN    Past Surgical History  Procedure Date  . Femoral hernia repair   . Vaginal hysterectomy   . Bladder surgery     tacting and sling  . Tonsillectomy   . Bone spurs     BOTH HEELS - NO SURGERY -JUST INJECTIONS    Family History  Problem Relation Age of Onset  . Uterine cancer Sister   . Breast cancer Sister   . Lung cancer Brother   . Cirrhosis Brother     drinker  . Alzheimer's disease Brother   . Colon cancer Neg Hx    Social History:  reports that she has never smoked. She has never used smokeless tobacco. She reports that she does not drink alcohol or use illicit drugs.  Allergies:  Allergies  Allergen Reactions  . Penicillins Cross Reactors Rash  . Sulfa Drugs Cross Reactors Rash    Medications Prior to Admission  Medication Sig Dispense Refill  . ALPRAZolam (XANAX) 0.5 MG tablet Take 0.5 mg by mouth. ALWAYS TAKES ONE AT BEDTIME FOR SLEEP--MIGHT TAKE 1/2 TAB DURING THE DAY IF NEEDED FOR ANXIETY      . amLODipine (NORVASC) 5 MG tablet Take 5 mg by mouth daily with breakfast.       . Calcium Carbonate-Vitamin D 600-400 MG-UNIT per tablet Take 1 tablet by mouth 2 (two) times daily.       . Cholecalciferol  (VITAMIN D3) 1000 UNITS CAPS Take 1 capsule by mouth daily.       Marland Kitchen estradiol (ESTRACE) 0.1 MG/GM vaginal cream Place 2 g vaginally 2 (two) times a week. Patient does not always use twice a week      . levothyroxine (SYNTHROID, LEVOTHROID) 50 MCG tablet Take 50 mcg by mouth daily with breakfast.       . losartan-hydrochlorothiazide (HYZAAR) 100-25 MG per tablet Take 1 tablet by mouth daily with breakfast.       . naproxen sodium (ANAPROX) 220 MG tablet Take 220 mg by mouth. WOULD TAKE IF SEVERE HIP PAIN      . nebivolol (BYSTOLIC) 10 MG tablet Take 5 mg by mouth daily. TAKES 1/2 TAB EVERY AM      . Omega-3 Fatty Acids (FISH OIL) 1000 MG CAPS Take 1 capsule by mouth daily.       Marland Kitchen omeprazole (PRILOSEC) 40 MG capsule Take 40 mg by mouth daily with breakfast.      . polyethylene glycol powder (MIRALAX) powder Take 17 g by mouth daily as needed. For constipation      . denosumab (PROLIA) 60 MG/ML SOLN Inject 60 mg  into the skin every 6 (six) months.       . Multiple Vitamin (MULTIVITAMIN) capsule Take 1 capsule by mouth daily.         No results found for this or any previous visit (from the past 48 hour(s)). No results found.  Review of Systems  All other systems reviewed and are negative.    Blood pressure 178/76, pulse 48, temperature 97.4 F (36.3 C), temperature source Oral, resp. rate 18, SpO2 97.00%. Physical Exam  Constitutional: She is oriented to person, place, and time. She appears well-developed and well-nourished.  HENT:  Head: Normocephalic and atraumatic.  Eyes: EOM are normal. Pupils are equal, round, and reactive to light.  Neck: Normal range of motion. Neck supple.  Cardiovascular: Normal rate and regular rhythm.   Respiratory: Effort normal and breath sounds normal.  GI: Soft. Bowel sounds are normal.  Musculoskeletal:       Left hip: She exhibits decreased range of motion, bony tenderness and crepitus.  Neurological: She is alert and oriented to person, place, and  time.  Skin: Skin is warm and dry.  Psychiatric: She has a normal mood and affect.     Assessment/Plan To the OR today for a left total hip replacement.  Dacen Frayre Y 11/29/2011, 7:03 AM

## 2011-11-29 NOTE — Anesthesia Preprocedure Evaluation (Addendum)
Anesthesia Evaluation  Patient identified by MRN, date of birth, ID band Patient awake    Reviewed: Allergy & Precautions, H&P , NPO status , Patient's Chart, lab work & pertinent test results  Airway Mallampati: II TM Distance: >3 FB Neck ROM: Full    Dental No notable dental hx.    Pulmonary neg pulmonary ROS,  breath sounds clear to auscultation  Pulmonary exam normal       Cardiovascular hypertension, Pt. on medications negative cardio ROS  Rhythm:Regular Rate:Normal     Neuro/Psych negative neurological ROS  negative psych ROS   GI/Hepatic negative GI ROS, Neg liver ROS, GERD-  Controlled,  Endo/Other  negative endocrine ROSHypothyroidism   Renal/GU negative Renal ROS  negative genitourinary   Musculoskeletal negative musculoskeletal ROS (+)   Abdominal   Peds negative pediatric ROS (+)  Hematology negative hematology ROS (+)   Anesthesia Other Findings   Reproductive/Obstetrics negative OB ROS                          Anesthesia Physical Anesthesia Plan  ASA: II  Anesthesia Plan: General   Post-op Pain Management:    Induction: Intravenous  Airway Management Planned:   Additional Equipment:   Intra-op Plan:   Post-operative Plan: Extubation in OR  Informed Consent: I have reviewed the patients History and Physical, chart, labs and discussed the procedure including the risks, benefits and alternatives for the proposed anesthesia with the patient or authorized representative who has indicated his/her understanding and acceptance.   Dental advisory given  Plan Discussed with: CRNA  Anesthesia Plan Comments:         Anesthesia Quick Evaluation

## 2011-11-29 NOTE — Anesthesia Postprocedure Evaluation (Signed)
  Anesthesia Post-op Note  Patient: Julie [name]  Procedure(s) Performed: Procedure(s) (LRB): TOTAL HIP ARTHROPLASTY ANTERIOR APPROACH (Left)  Patient Location: PACU  Anesthesia Type: General  Level of Consciousness: awake and alert   Airway and Oxygen Therapy: Patient Spontanous Breathing  Post-op Pain: mild  Post-op Assessment: Post-op Vital signs reviewed, Patient's Cardiovascular Status Stable, Respiratory Function Stable, Patent Airway and No signs of Nausea or vomiting  Post-op Vital Signs: stable  Complications: No apparent anesthesia complications

## 2011-11-29 NOTE — Progress Notes (Signed)
Utilization review completed.  

## 2011-11-30 LAB — BASIC METABOLIC PANEL
Calcium: 8.4 mg/dL (ref 8.4–10.5)
Creatinine, Ser: 0.59 mg/dL (ref 0.50–1.10)
GFR calc non Af Amer: 87 mL/min — ABNORMAL LOW (ref >90)
Glucose, Bld: 107 mg/dL — ABNORMAL HIGH (ref 70–99)
Sodium: 138 mEq/L (ref 135–145)

## 2011-11-30 LAB — CBC
Hemoglobin: 10 g/dL — ABNORMAL LOW (ref 12.0–15.0)
MCH: 31.6 pg (ref 26.0–34.0)
MCHC: 35.1 g/dL (ref 30.0–36.0)
MCV: 90.2 fL (ref 78.0–100.0)

## 2011-11-30 MED ORDER — POTASSIUM CHLORIDE CRYS ER 20 MEQ PO TBCR
40.0000 meq | EXTENDED_RELEASE_TABLET | Freq: Two times a day (BID) | ORAL | Status: AC
  Administered 2011-11-30 (×2): 40 meq via ORAL
  Filled 2011-11-30 (×2): qty 2

## 2011-11-30 NOTE — Progress Notes (Signed)
OT Cancellation Note  Treatment cancelled today due to medical issues with patient which prohibited therapy: pt infiltrated IV; RN attempting to change IV. Have checked on pt twice this am. Will check back as schedule allows. Thank you.  Glendale Chard, OTR/L Pager: [phone] 11/30/2011    Nur Krasinski 11/30/2011, 10:26 AM

## 2011-11-30 NOTE — Progress Notes (Signed)
Physical Therapy Treatment Patient Details Name: Julie [name] MRN: [id number] DOB: 02/19/1933 Today's Date: 11/30/2011 Time: 4098-1191 PT Time Calculation (min): 20 min  PT Assessment / Plan / Recommendation Comments on Treatment Session  pt continues to progress w/ mobility, and gait sequence.    Follow Up Recommendations  Home health PT;Supervision/Assistance - 24 hour    Barriers to Discharge        Equipment Recommendations  Rolling walker with 5" wheels;3 in 1 bedside comode    Recommendations for Other Services OT consult  Frequency 7X/week   Plan Discharge plan remains appropriate;Frequency remains appropriate    Precautions / Restrictions Restrictions Weight Bearing Restrictions: No   Pertinent Vitals/Pain 3/10    Mobility  Bed Mobility Bed Mobility: Sit to Supine Supine to Sit: 4: Min assist;HOB elevated Sit to Supine: 3: Mod assist Details for Bed Mobility Assistance: HOB at 50 and used trapeze to get oob then support LLE onto bed, Transfers Transfers: Sit to Stand;Stand to Sit Sit to Stand: 4: Min assist;From bed;With upper extremity assist;From elevated surface Stand to Sit: To bed;With upper extremity assist Details for Transfer Assistance: VC for push from bed and reach top bed., placing LLE forward. Ambulation/Gait Ambulation/Gait Assistance: 1: +2 Total assist Ambulation/Gait: Patient Percentage: 70% Ambulation Distance (Feet): 30 Feet Assistive device: Rolling walker Ambulation/Gait Assistance Details: VC to try placing RLE first., vc for posture. Gait Pattern: Step-through pattern;Step-to pattern;Decreased hip/knee flexion - left    Exercises Total Joint Exercises Ankle Circles/Pumps: AROM;Both;10 reps Short Arc Quad: AROM;Left;10 reps;Supine Heel Slides: AAROM;Left;20 reps;Supine Hip ABduction/ADduction: AAROM;Left;20 reps;Supine   PT Diagnosis:    PT Problem List:   PT Treatment Interventions:     PT Goals Acute Rehab PT Goals Pt will  go Supine/Side to Sit: with supervision PT Goal: Supine/Side to Sit - Progress: Progressing toward goal Pt will go Sit to Supine/Side: with supervision PT Goal: Sit to Supine/Side - Progress: Progressing toward goal Pt will go Sit to Stand: with supervision PT Goal: Sit to Stand - Progress: Progressing toward goal Pt will go Stand to Sit: with supervision PT Goal: Stand to Sit - Progress: Progressing toward goal Pt will Ambulate: 51 - 150 feet;with supervision;with rolling walker PT Goal: Ambulate - Progress: Progressing toward goal  Visit Information  Last PT Received On: 11/30/11 Assistance Needed: +2    Subjective Data  Subjective: it makes me feel good to get up .   Cognition  Overall Cognitive Status: Appears within functional limits for tasks assessed/performed    Balance     End of Session PT - End of Session Activity Tolerance: Patient tolerated treatment well Patient left: in bed;with call bell/phone within reach Nurse Communication: Mobility status    Rada Hay 11/30/2011, 3:44 PM [phone]

## 2011-11-30 NOTE — Progress Notes (Signed)
Subjective: 1 Day Post-Op Procedure(s) (LRB): TOTAL HIP ARTHROPLASTY ANTERIOR APPROACH (Left) Patient reports pain as moderate.    Objective: Vital signs in last 24 hours: Temp:  [97.1 F (36.2 C)-100.4 F (38 C)] 99 F (37.2 C) (05/18 0515) Pulse Rate:  [44-66] 62  (05/18 0515) Resp:  [8-17] 16  (05/18 0515) BP: (116-155)/(53-80) 132/70 mmHg (05/18 0515) SpO2:  [95 %-100 %] 98 % (05/18 0515) Weight:  [65.772 kg (145 lb)] 65.772 kg (145 lb) (05/17 1101)  Intake/Output from previous day: 05/17 0701 - 05/18 0700 In: 3092.5 [P.O.:460; I.V.:2532.5; IV Piggyback:100] Out: 1695 [Urine:1445; Blood:250] Intake/Output this shift: Total I/O In: 480 [P.O.:480] Out: 500 [Urine:500]   Basename 11/30/11 0400  HGB 10.0*    Basename 11/30/11 0400  WBC 7.4  RBC 3.16*  HCT 28.5*  PLT 132*    Basename 11/30/11 0400  NA 138  K 2.7*  CL 101  CO2 30  BUN 7  CREATININE 0.59  GLUCOSE 107*  CALCIUM 8.4   No results found for this basename: LABPT:2,INR:2 in the last 72 hours  Sensation intact distally Intact pulses distally Dorsiflexion/Plantar flexion intact Incision: dressing C/D/I  Assessment/Plan: 1 Day Post-Op Procedure(s) (LRB): TOTAL HIP ARTHROPLASTY ANTERIOR APPROACH (Left) Up with therapy  Caryle Helgeson Y 11/30/2011, 10:53 AM

## 2011-11-30 NOTE — Op Note (Signed)
[name], [name] NO.:  [id number]  MEDICAL RECORD NO.:  [id number]  LOCATION:  1606                         FACILITY:  [facility]  PHYSICIAN:  Vanita Panda. Magnus Ivan, M.D.DATE OF BIRTH:  03-30-1933  DATE OF PROCEDURE:  11/29/2011 DATE OF DISCHARGE:                              OPERATIVE REPORT   PREOPERATIVE DIAGNOSIS:  Severe end-stage arthritis, left hip.  POSTOPERATIVE DIAGNOSIS:  Severe end-stage arthritis, left hip.  PROCEDURE:  Left total hip arthroplasty.  IMPLANTS:  DePuy Sector Gription acetabular component size 50, size 32+ 4 neutral polyethylene liner, Corail femoral component size 12 with standard offset, size 32+ 1 metal hip ball.  SURGEON:  Vanita Panda. Magnus Ivan, M.D.  ANESTHESIA:  General.  ANTIBIOTICS:  600 mg IV clindamycin.  BLOOD LOSS:  250 cc.  COMPLICATIONS:  None.  INDICATIONS:  Julie [name] is a 78 year old female with bilateral hip severe osteoarthritis.  She has already had a previous right total hip arthroplasty.  She wished to proceed with a left total hip arthroplasty now given the failure of conservative treatment, daily pain, decrease in quality of life and mobility.  The risks and benefits of this were explained to her in detail and she does wish to proceed with surgery.  PROCEDURE DESCRIPTION:  After informed consent was obtained, appropriate left hip was marked.  She was brought to the operating room.  General anesthesia was obtained while she was on her stretcher.  A Foley catheter was placed and then both feet were placed into traction boots and will be placed on the Hana fracture table.  She was placed on this Hana table supine with the perineal post in place and both legs placed in traction devices, but no traction applied.  Her left operative hip was then prepped and draped with DuraPrep and sterile drapes.  A time- out was called and she was identified as correct patient, correct left hip.  I then made an  anterior incision just distal and lateral to the anterior superior iliac spine and carried this obliquely down the leg. I was able to dissect down to the tensor fascia lata and the tensor fascia was divided obliquely.  I proceeded with a direct anterior approach to the hip.  A Cobra retractor was placed around the lateral neck and then up underneath the rectus femoris, a medial retractor was placed.  I cauterized the lateral femoral circumflex vessels and then divided the hip capsule, put the Cobra retractors in the hip capsule.  I made my femoral neck cut then proximal to the lesser trochanter.  I placed a corkscrew guide into the femoral head and removed the femoral head in its entirety.  I then cleaned the acetabulum of debris and placed a Bent Hohmann medially and a Cobra laterally.  I then began reaming from a size 42 reamer in 2-mm increments up to a size 50 with the size 50 reamer placed under direct fluoroscopy as well as direct visualization.  I then chose a size 50 acetabular component from DePuy, which was a Sector Gription component and placed this into the acetabulum, knocked it into place, and it was stable.  I placed the whole eliminator guide and the real 32+  4 neutral polyethylene liner. Attention was then turned to the femur with the leg externally rotated, extended, and adducted.  I gained access to the femoral canal.  I used a Bovie to release the lateral capsule.  I then used a box cutting guide to open the femoral canal and began broaching from a size 8 and broach all the way up to a size 12 __________ to be stable, so we trialed a standard neck and a 32+ 1 hip ball and brought the leg back over and up and pulled traction and internal rotation and placed it back into the socket.  There was minimal shuck with good stability on internal and external rotation.  Her leg-lengths showed that she was just a slight bit longer on that side, so I was able to dislocate the hip,  bring it down and over and sink the stem a little bit more.  I then removed all trial components.  I placed the real size 12 femoral component with standard offset and I placed a real 32+ 1 metal hip ball and reduced this back in the acetabulum and it was stable and her leg lengths were near equal.  We then copiously irrigated the soft tissue with normal saline solution.  I closed the joint capsule with interrupted #1 Ethibond suture followed by running 0 V-Loc suture in the tensor fascia lata, 2-0 Vicryl in subcutaneous tissue, and staples to close the skin. Well-padded sterile dressing was applied.  She was taken off the Hana table onto a stretcher, awakened, extubated, and taken to recovery room in stable condition.  All final counts were correct.  There were no complications noted.     Vanita Panda. Magnus Ivan, M.D.     CYB/MEDQ  D:  11/29/2011  T:  11/30/2011  Job:  [id number]

## 2011-11-30 NOTE — Progress Notes (Signed)
CRITICAL VALUE ALERT  Critical value received:  K+ 2.7  Date of notification:  11/30/2011   Time of notification:  0518  Critical value read back:yes  Nurse who received alert:  Junie Panning  MD notified (1st page):  blackman  Time of first page:  0604  MD notified (2nd page):  Time of second page:  Responding MD:  blackman  Time MD responded:  [phone]

## 2011-11-30 NOTE — Evaluation (Signed)
Physical Therapy Evaluation Patient Details Name: Julie [name] MRN: [id number] DOB: March 14, 1933 Today's Date: 11/30/2011 Time: 0800-0830 PT Time Calculation (min): 30 min  PT Assessment / Plan / Recommendation Clinical Impression  pt is s/p direct ant. THA on L who tolerated ambulation today. pt plans DC home w24/7. pt will benefit from PT to improve ROM, strength and functional mobility .    PT Assessment  Patient needs continued PT services    Follow Up Recommendations  Home health PT    Barriers to Discharge        lEquipment Recommendations  Rolling walker with 5" wheels    Recommendations for Other Services OT consult   Frequency 7X/week    Precautions / Restrictions Restrictions Weight Bearing Restrictions: No   Pertinent Vitals/Pain 3 to 7 /10 w/ ambulation      Mobility  Bed Mobility Bed Mobility: Supine to Sit Supine to Sit: 4: Min assist;HOB elevated Details for Bed Mobility Assistance: HOB at 45, vc for how to rotate on edge of bed as she slides  to edge Transfers Transfers: Sit to Stand;Stand to Sit Sit to Stand: 3: Mod assist;From bed;With upper extremity assist;From elevated surface Stand to Sit: 4: Min assist;With upper extremity assist;To chair/3-in-1 Details for Transfer Assistance: Pt's L leg tended to buckle w/ sit to stand and required 2 attempts and vc for pushing off w/ UEs' Ambulation/Gait Ambulation/Gait Assistance: 1: +2 Total assist Ambulation/Gait: Patient Percentage: 70% Ambulation Distance (Feet): 40 Feet Assistive device: Rolling walker Ambulation/Gait Assistance Details: Pt initially had difficulty advancing LLE first several steps, gradually able to advance.  VC for gait sequence and posture.    Exercises     PT Diagnosis: Difficulty walking;Acute pain  PT Problem List: Decreased strength;Decreased range of motion;Decreased activity tolerance;Decreased mobility;Decreased knowledge of use of DME PT Treatment Interventions: DME  instruction;Gait training;Functional mobility training;Therapeutic activities;Therapeutic exercise;Patient/family education   PT Goals Acute Rehab PT Goals PT Goal Formulation: With patient Time For Goal Achievement: 12/07/11 Potential to Achieve Goals: Good Pt will go Supine/Side to Sit: with supervision PT Goal: Supine/Side to Sit - Progress: Goal set today Pt will go Sit to Supine/Side: with supervision PT Goal: Sit to Supine/Side - Progress: Goal set today Pt will go Sit to Stand: with supervision PT Goal: Sit to Stand - Progress: Goal set today Pt will go Stand to Sit: with supervision PT Goal: Stand to Sit - Progress: Goal set today Pt will Ambulate: 51 - 150 feet;with supervision;with rolling walker PT Goal: Ambulate - Progress: Goal set today Pt will Perform Home Exercise Program: with supervision, verbal cues required/provided PT Goal: Perform Home Exercise Program - Progress: Goal set today  Visit Information  Last PT Received On: 11/30/11 Assistance Needed: +2    Subjective Data  Subjective: i dread this Patient Stated Goal: to go to my boyfriend's house   Prior Functioning  Home Living Lives With: Significant other Available Help at Discharge: Friend(s);Available 24 hours/day Type of Home: House Home Access: Level entry Home Layout: Two level (pt has stair glide to 2nd level) Bathroom Shower/Tub: Health visitor: Handicapped height Prior Function Level of Independence: Independent Able to Take Stairs?: Yes Communication Communication: No difficulties    Cognition  Overall Cognitive Status: Appears within functional limits for tasks assessed/performed Arousal/Alertness: Awake/alert    Extremity/Trunk Assessment Right Lower Extremity Assessment RLE ROM/Strength/Tone: Within functional levels RLE Sensation: WFL - Light Touch Left Lower Extremity Assessment LLE ROM/Strength/Tone: Deficits LLE ROM/Strength/Tone Deficits: pt was able to  advance  LLE during gait but slowly and w/ effort. pt did slide LLE to edge of bed w/ no assistance LLE Sensation: WFL - Light Touch Trunk Assessment Trunk Assessment: Normal   Balance    End of Session PT - End of Session Activity Tolerance: Patient tolerated treatment well Patient left: in chair;with call bell/phone within reach Nurse Communication: Mobility status   Rada Hay 11/30/2011, 8:58 AM  [phone]

## 2011-12-01 LAB — CBC
MCH: 32.1 pg (ref 26.0–34.0)
MCHC: 35.7 g/dL (ref 30.0–36.0)
Platelets: 135 10*3/uL — ABNORMAL LOW (ref 150–400)
RBC: 2.96 MIL/uL — ABNORMAL LOW (ref 3.87–5.11)
RDW: 12.7 % (ref 11.5–15.5)

## 2011-12-01 MED ORDER — SODIUM CHLORIDE 0.9 % IV SOLN: INTRAVENOUS | Status: DC

## 2011-12-01 NOTE — Progress Notes (Signed)
Physical Therapy Treatment Patient Details Name: Julie [name] MRN: [id number] DOB: Jul 28, 1932 Today's Date: 12/01/2011 Time: 9562-1308 PT Time Calculation (min): 20 min  PT Assessment / Plan / Recommendation Comments on Treatment Session  pt able to advance LLE better today, increase in endurance     Follow Up Recommendations  Home health PT;Supervision/Assistance - 24 hour    Barriers to Discharge        Equipment Recommendations  Rolling walker with 5" wheels;3 in 1 bedside comode    Recommendations for Other Services OT consult  Frequency 7X/week   Plan Discharge plan remains appropriate;Frequency remains appropriate    Precautions / Restrictions     Pertinent Vitals/Pain 3/10    Mobility  Bed Mobility Bed Mobility: Sit to Supine Sit to Supine: 4: Min assist Details for Bed Mobility Assistance: min A to get LLE onto bed. Transfers Transfers: Sit to Stand;Stand to Sit Sit to Stand: 4: Min assist;From chair/3-in-1 Stand to Sit: To bed;4: Min assist Details for Transfer Assistance: VXC to reach to bed, slide LLE forward, Ambulation/Gait Ambulation/Gait Assistance: 4: Min assist Ambulation Distance (Feet): 150 Feet Assistive device: Rolling walker Ambulation/Gait Assistance Details: improved to advance LLE first. Gait Pattern: Step-through pattern    Exercises     PT Diagnosis:    PT Problem List:   PT Treatment Interventions:     PT Goals Acute Rehab PT Goals Pt will go Sit to Supine/Side: with supervision PT Goal: Sit to Supine/Side - Progress: Progressing toward goal Pt will go Sit to Stand: with supervision PT Goal: Sit to Stand - Progress: Progressing toward goal Pt will go Stand to Sit: with supervision PT Goal: Stand to Sit - Progress: Progressing toward goal Pt will Ambulate: 51 - 150 feet;with supervision;with rolling walker PT Goal: Ambulate - Progress: Progressing toward goal  Visit Information  Last PT Received On: 12/01/11 Assistance  Needed: +1    Subjective Data  Subjective: i am ready to walk   Cognition  Overall Cognitive Status: Appears within functional limits for tasks assessed/performed    Balance     End of Session PT - End of Session Activity Tolerance: Patient tolerated treatment well Patient left: in bed;with call bell/phone within reach Nurse Communication: Mobility status    Rada Hay 12/01/2011, 4:07 PM [phone]

## 2011-12-01 NOTE — Progress Notes (Signed)
Physical Therapy Treatment Patient Details Name: Julie [name] MRN: [id number] DOB: 1933/05/21 Today's Date: 12/01/2011 Time: 8295-6213 PT Time Calculation (min): 16 min  PT Assessment / Plan / Recommendation Comments on Treatment Session  tolerated actve ROM well.    Follow Up Recommendations  Home health PT;Supervision/Assistance - 24 hour    Barriers to Discharge        Equipment Recommendations  Rolling walker with 5" wheels;3 in 1 bedside comode    Recommendations for Other Services OT consult  Frequency 7X/week   Plan Discharge plan remains appropriate;Frequency remains appropriate    Precautions / Restrictions     Pertinent Vitals/Pain 3/10    Mobility      Exercises Total Joint Exercises Ankle Circles/Pumps: AROM;Both;10 reps Short Arc Quad: AROM;Left;10 reps;Supine Heel Slides: AAROM;Left;20 reps;Supine Hip ABduction/ADduction: AAROM;Left;20 reps;Supine   PT Diagnosis:    PT Problem List:   PT Treatment Interventions:     PT Goals Acute Rehab PT Goals Pt will go Sit to Supine/Side: with supervision PT Goal: Sit to Supine/Side - Progress: Progressing toward goal Pt will go Sit to Stand: with supervision PT Goal: Sit to Stand - Progress: Progressing toward goal Pt will go Stand to Sit: with supervision PT Goal: Stand to Sit - Progress: Progressing toward goal Pt will Ambulate: 51 - 150 feet;with supervision;with rolling walker PT Goal: Ambulate - Progress: Progressing toward goal Pt will Perform Home Exercise Program: with supervision, verbal cues required/provided PT Goal: Perform Home Exercise Program - Progress: Progressing toward goal  Visit Information  Last PT Received On: 12/01/11 Assistance Needed: +1    Subjective Data  Subjective: it feels good to flex my knee.   Cognition  Overall Cognitive Status: Appears within functional limits for tasks assessed/performed    Balance     End of Session PT - End of Session Activity Tolerance:  Patient tolerated treatment well Patient left: in bed;with call bell/phone within reach Nurse Communication: Mobility status    Rada Hay 12/01/2011, 4:10 PM

## 2011-12-01 NOTE — Progress Notes (Signed)
Cm spoke with pt concerning dc planning. Pt offered choice of Hh providers for [facility], per pt gentiva referred by MD to provide [facility] services. RW,3n1 delivered to room prior to interview. Pt has live-in boyfriend whom will assist in home care. No other needs stated.   Leonie Green [phone]

## 2011-12-01 NOTE — Progress Notes (Signed)
Physical Therapy Treatment Patient Details Name: Julie [name] MRN: [id number] DOB: March 31, 1933 Today's Date: 12/01/2011 Time: 1400-1420 PT Time Calculation (min): 20 min  PT Assessment / Plan / Recommendation Comments on Treatment Session  progressing w/ ambulation. May DC tomorrow    Follow Up Recommendations  Home health PT;Supervision/Assistance - 24 hour    Barriers to Discharge        Equipment Recommendations  Rolling walker with 5" wheels;3 in 1 bedside comode    Recommendations for Other Services OT consult  Frequency 7X/week   Plan Discharge plan remains appropriate;Frequency remains appropriate    Precautions / Restrictions     Pertinent Vitals/Pain 6/10 RN notified.  Cold packs applied.    Mobility  Bed Mobility Bed Mobility: Sit to Supine Supine to Sit: 4: Min guard;With rails;HOB elevated Sit to Supine: 4: Min assist Details for Bed Mobility Assistance: pt can get to sitting w/ less assist but requires assist for LLE onto bed. Transfers Transfers: Sit to Stand;Stand to [employer] Transfers Sit to Stand: From chair/3-in-1;From bed;4: Min assist Stand to Sit: To chair/3-in-1;To bed;4: Min assist Stand Pivot Transfers: 4: Min assist Details for Transfer Assistance: VC to hold onto RW as she turns to [facility].  Ambulation/Gait Ambulation/Gait Assistance: 4: Min assist Ambulation Distance (Feet): 150 Feet Assistive device: Rolling walker Ambulation/Gait Assistance Details: improved to advance LLE first Gait Pattern: Step-through pattern    Exercises    PT Diagnosis:    PT Problem List:   PT Treatment Interventions:     PT Goals Acute Rehab PT Goals Pt will go Supine/Side to Sit: with supervision PT Goal: Supine/Side to Sit - Progress: Progressing toward goal Pt will go Sit to Supine/Side: with supervision PT Goal: Sit to Supine/Side - Progress: Progressing toward goal Pt will go Sit to Stand: with supervision PT Goal: Sit to Stand - Progress:  Progressing toward goal Pt will go Stand to Sit: with supervision PT Goal: Stand to Sit - Progress: Progressing toward goal Pt will Ambulate: 51 - 150 feet;with supervision;with rolling walker PT Goal: Ambulate - Progress: Progressing toward goal Pt will Perform Home Exercise Program: with supervision, verbal cues required/provided PT Goal: Perform Home Exercise Program - Progress: Progressing toward goal  Visit Information  Last PT Received On: 12/01/11 Assistance Needed: +1    Subjective Data  Subjective: i have to go to the BR   Cognition  Overall Cognitive Status: Appears within functional limits for tasks assessed/performed    Balance     End of Session PT - End of Session Activity Tolerance: Patient tolerated treatment well Patient left: in bed;with call bell/phone within reach;with family/visitor present Nurse Communication: Mobility status and pain     Rada Hay 12/01/2011, 4:16 PM [phone]

## 2011-12-01 NOTE — Progress Notes (Signed)
Subjective: 2 Days Post-Op Procedure(s) (LRB): TOTAL HIP ARTHROPLASTY ANTERIOR APPROACH (Left) Patient reports pain as mild.  Hgb down but asymptomatic acute blood loss anemia.  Objective: Vital signs in last 24 hours: Temp:  [97.7 F (36.5 C)-100.3 F (37.9 C)] 97.7 F (36.5 C) (05/19 0530) Pulse Rate:  [61-70] 61  (05/19 0530) Resp:  [16-18] 16  (05/19 0800) BP: (121-146)/(58-69) 121/69 mmHg (05/19 0530) SpO2:  [98 %-100 %] 99 % (05/19 0800)  Intake/Output from previous day: 05/18 0701 - 05/19 0700 In: 1959.1 [P.O.:1200; I.V.:759.1] Out: 1850 [Urine:1850] Intake/Output this shift: Total I/O In: 240 [P.O.:240] Out: 200 [Urine:200]   Basename 12/01/11 0526 11/30/11 0400  HGB 9.5* 10.0*    Basename 12/01/11 0526 11/30/11 0400  WBC 9.5 7.4  RBC 2.96* 3.16*  HCT 26.6* 28.5*  PLT 135* 132*    Basename 11/30/11 0400  NA 138  K 2.7*  CL 101  CO2 30  BUN 7  CREATININE 0.59  GLUCOSE 107*  CALCIUM 8.4   No results found for this basename: LABPT:2,INR:2 in the last 72 hours  Sensation intact distally Intact pulses distally Dorsiflexion/Plantar flexion intact Incision: dressing C/D/I  Assessment/Plan: 2 Days Post-Op Procedure(s) (LRB): TOTAL HIP ARTHROPLASTY ANTERIOR APPROACH (Left) Plan for discharge tomorrow or Tuesday pending progress. D/C PCA  Ki Luckman Y 12/01/2011, 9:17 AM

## 2011-12-02 LAB — CBC
HCT: 26 % — ABNORMAL LOW (ref 36.0–46.0)
Platelets: 123 10*3/uL — ABNORMAL LOW (ref 150–400)
RDW: 12.7 % (ref 11.5–15.5)
WBC: 8.7 10*3/uL (ref 4.0–10.5)

## 2011-12-02 MED ORDER — METHOCARBAMOL 500 MG PO TABS: 500.0000 mg | ORAL_TABLET | Freq: Four times a day (QID) | ORAL | Status: AC | PRN

## 2011-12-02 MED ORDER — RIVAROXABAN 10 MG PO TABS: 10.0000 mg | ORAL_TABLET | Freq: Every day | ORAL | Status: DC

## 2011-12-02 MED ORDER — BISACODYL 10 MG RE SUPP
10.0000 mg | Freq: Every day | RECTAL | Status: DC | PRN
  Administered 2011-12-02: 10 mg via RECTAL
  Filled 2011-12-02: qty 1

## 2011-12-02 MED ORDER — OXYCODONE-ACETAMINOPHEN 5-325 MG PO TABS: 1.0000 | ORAL_TABLET | ORAL | Status: AC | PRN

## 2011-12-02 NOTE — Progress Notes (Signed)
12/02/2011 Raynelle Bring BSN CCM [phone] Pt plans to go to caregiver's home on [place], [place]. Pt lives in [place] notified that patient will be going to [place] address instaed of [place], address. She currently does not have specifi address but caregiver will be able to provide. Genevieve Norlander will get address. HH services to start day after discharge.

## 2011-12-02 NOTE — Progress Notes (Signed)
Occupational Therapy Note Chart reviewed. Spoke to patient and she doesn't feel she needs OT. She has all DME and 3in1 already taken home. Will sign off per pt request. She has assist available also at discharge. Judithann Sauger OTR/L [phone] 12/02/2011

## 2011-12-02 NOTE — Discharge Summary (Signed)
Patient ID: RODOLFO NOTARO MRN: [id number] DOB/AGE: Dec 01, 1932 78 y.o.  Admit date: 11/29/2011 Discharge date: 12/02/2011  Admission Diagnoses:  Principal Problem:  *Degenerative arthritis of hip   Discharge Diagnoses:  Same  Past Medical History  Diagnosis Date  . Anxiety   . Hyperlipidemia   . Hypertension   . Hypothyroidism   . Osteoporosis   . GERD (gastroesophageal reflux disease)   . Gastritis   . Diverticulosis   . Hemorrhoids   . Arthritis     SEVERE OA LEFT HIP  - S/P RIGHT HIP REPLACEMENT / OCCASIONAL BACK AND NECK PAIN    Surgeries: Procedure(s): TOTAL HIP ARTHROPLASTY ANTERIOR APPROACH on 11/29/2011   Consultants:    Discharged Condition: Improved  Hospital Course: Julie [name] is an 78 y.o. female who was admitted 11/29/2011 for operative treatment ofDegenerative arthritis of hip. Patient has severe unremitting pain that affects sleep, daily activities, and work/hobbies. After pre-op clearance the patient was taken to the operating room on 11/29/2011 and underwent  Procedure(s): TOTAL HIP ARTHROPLASTY ANTERIOR APPROACH.    Patient was given perioperative antibiotics: Anti-infectives     Start     Dose/Rate Route Frequency Ordered Stop   11/29/11 0524   clindamycin (CLEOCIN) IVPB 600 mg        600 mg 100 mL/hr over 30 Minutes Intravenous 60 min pre-op 11/29/11 0524 11/29/11 0700           Patient was given sequential compression devices, early ambulation, and chemoprophylaxis to prevent DVT.  Patient benefited maximally from hospital stay and there were no complications.    Recent vital signs: Patient Vitals for the past 24 hrs:  BP Temp Temp src Pulse Resp SpO2  12/02/11 1300 132/61 mmHg 99.7 F (37.6 C) - 65  16  97 %  12/02/11 0507 111/64 mmHg 98.7 F (37.1 C) Oral 60  16  90 %  12/12/11 2141 123/67 mmHg 98.8 F (37.1 C) Oral 62  16  90 %     Recent laboratory studies:  Basename 12/02/11 0424 12/12/11 0526 11/30/11 0400  WBC 8.7  9.5 --  HGB 9.0* 9.5* --  HCT 26.0* 26.6* --  PLT 123* 135* --  NA -- -- 138  K -- -- 2.7*  CL -- -- 101  CO2 -- -- 30  BUN -- -- 7  CREATININE -- -- 0.59  GLUCOSE -- -- 107*  INR -- -- --  CALCIUM -- -- 8.4     Discharge Medications:   Medication List  As of 12/02/2011  7:00 PM   STOP taking these medications         naproxen sodium 220 MG tablet         TAKE these medications         ALPRAZolam 0.5 MG tablet   Commonly known as: XANAX   Take 0.5 mg by mouth. ALWAYS TAKES ONE AT BEDTIME FOR SLEEP--MIGHT TAKE 1/2 TAB DURING THE DAY IF NEEDED FOR ANXIETY      amLODipine 5 MG tablet   Commonly known as: NORVASC   Take 5 mg by mouth daily with breakfast.      Calcium Carbonate-Vitamin D 600-400 MG-UNIT per tablet   Take 1 tablet by mouth 2 (two) times daily.      estradiol 0.1 MG/GM vaginal cream   Commonly known as: ESTRACE   Place 2 g vaginally 2 (two) times a week. Patient does not always use twice a week      Fish  Oil 1000 MG Caps   Take 1 capsule by mouth daily.      levothyroxine 50 MCG tablet   Commonly known as: SYNTHROID, LEVOTHROID   Take 50 mcg by mouth daily with breakfast.      losartan-hydrochlorothiazide 100-25 MG per tablet   Commonly known as: HYZAAR   Take 1 tablet by mouth daily with breakfast.      methocarbamol 500 MG tablet   Commonly known as: ROBAXIN   Take 1 tablet (500 mg total) by mouth every 6 (six) hours as needed.      MIRALAX powder   Generic drug: polyethylene glycol powder   Take 17 g by mouth daily as needed. For constipation      multivitamin capsule   Take 1 capsule by mouth daily.      nebivolol 10 MG tablet   Commonly known as: BYSTOLIC   Take 5 mg by mouth daily. TAKES 1/2 TAB EVERY AM      omeprazole 40 MG capsule   Commonly known as: PRILOSEC   Take 40 mg by mouth daily with breakfast.      oxyCODONE-acetaminophen 5-325 MG per tablet   Commonly known as: PERCOCET   Take 1-2 tablets by mouth every 4 (four)  hours as needed for pain.      PROLIA 60 MG/ML Soln injection   Generic drug: denosumab   Inject 60 mg into the skin every 6 (six) months.      rivaroxaban 10 MG Tabs tablet   Commonly known as: XARELTO   Take 1 tablet (10 mg total) by mouth daily with breakfast.      Vitamin D3 1000 UNITS Caps   Take 1 capsule by mouth daily.            Diagnostic Studies: Dg Chest 2 View  11/25/2011  *RADIOLOGY REPORT*  Clinical Data: Preop left hip arthroplasty  CHEST - 2 VIEW  Comparison: 01/18/2008  Findings: Heart size upper normal.  Vascularity is normal. Negative for mass lesion.  Lungs are clear without infiltrate or effusion.  IMPRESSION: No active cardiopulmonary disease.  Original Report Authenticated By: Camelia Phenes, M.D.   Dg Hip Complete Left  11/29/2011  *RADIOLOGY REPORT*  Clinical Data: 78 year old female undergoing left hip replacement.  DG C-ARM 1-60 MIN - NRPT [facility], LEFT HIP - COMPLETE 2+ VIEW  Technique: Two intraoperative fluoroscopic views of the pelvis.  Fluoroscopy time of 0.3 minutes was utilized.  Comparison:  Chest radiographs 11/25/2011.  Findings: Left total or bipolar hip arthroplasty in place. Sequelae of right hip arthroplasty also partially visible. Hardware component on the left appear intact and normally aligned.  IMPRESSION: Left hip arthroplasty with no adverse features evident on intraoperative fluoro.  Original Report Authenticated By: Harley Hallmark, M.D.   Dg Pelvis Portable  11/29/2011  *RADIOLOGY REPORT*  Clinical Data: 78 year old female status post left hip surgery.  PORTABLE PELVIS  Comparison: 0741 hours the same day and earlier.  Findings: 2 portable AP views at 1005 hours.  Bilateral hip arthroplasties, acute on the left.  Hardware components appear intact and normally aligned.  No unexpected osseous changes. Overlying soft tissue postoperative changes with skin staples in place.  IMPRESSION: No adverse features status post left hip arthroplasty.   Original Report Authenticated By: Harley Hallmark, M.D.   Dg Hip Portable 1 View Left  11/29/2011  *RADIOLOGY REPORT*  Clinical Data: 78 year old female status post left hip arthroplasty.  PORTABLE LEFT HIP - 1 VIEW  Comparison:  0741 hours the same day and earlier.  Findings: Portable cross-table lateral views at 1010 hours.  Left femoral arthroplasty hardware components are normally aligned. Partial visualization of the right hip arthroplasty.  Postoperative changes to the overlying soft tissues again noted.  IMPRESSION: Normal alignment of the left hip arthroplasty.  Original Report Authenticated By: Harley Hallmark, M.D.   Dg C-arm 1-60 Min-no Report  11/29/2011  *RADIOLOGY REPORT*  Clinical Data: 78 year old female undergoing left hip replacement.  DG C-ARM 1-60 MIN - [facility]PT [facility], LEFT HIP - COMPLETE 2+ VIEW  Technique: Two intraoperative fluoroscopic views of the pelvis.  Fluoroscopy time of 0.3 minutes was utilized.  Comparison:  Chest radiographs 11/25/2011.  Findings: Left total or bipolar hip arthroplasty in place. Sequelae of right hip arthroplasty also partially visible. Hardware component on the left appear intact and normally aligned.  IMPRESSION: Left hip arthroplasty with no adverse features evident on intraoperative fluoro.  Original Report Authenticated By: Harley Hallmark, M.D.    Disposition: to home  Discharge Orders    Future Orders Please Complete By Expires   Diet - low sodium heart healthy      Call MD / Call 911      Comments:   If you experience chest pain or shortness of breath, CALL 911 and be transported to the hospital emergency room.  If you develope a fever above 101 F, pus (white drainage) or increased drainage or redness at the wound, or calf pain, call your surgeon's office.   Constipation Prevention      Comments:   Drink plenty of fluids.  Prune juice may be helpful.  You may use a stool softener, such as Colace (over the counter) 100 mg twice a day.  Use MiraLax  (over the counter) for constipation as needed.   Increase activity slowly as tolerated      Weight Bearing as taught in Physical Therapy      Comments:   Use a walker or crutches as instructed.   Discharge instructions      Comments:   You can get your actual incision wet starting this Wed. 5/22. Dry dressing daily left hip. Follow-up in 2 weeks.   Discharge patient            Signed: Kathryne Hitch 12/02/2011, 7:00 PM

## 2011-12-02 NOTE — Progress Notes (Signed)
Physical Therapy Treatment Patient Details Name: Julie [name] MRN: [id number] DOB: Nov 16, 1932 Today's Date: 12/02/2011 Time: 5366-4403 PT Time Calculation (min): 43 min  PT Assessment / Plan / Recommendation Comments on Treatment Session  Pt encouraged to call for assistance every time she gets up for safetyu. pt did lose her balance when opening BR door.  Working w/ pt on bedmobility from very high bed. pt has her DME, plans DC tomorrow. Pt may be safer to sleep in a lower bed as discussed w/pt today.    Follow Up Recommendations  Home health PT;Supervision/Assistance - 24 hour    Barriers to Discharge        Equipment Recommendations       Recommendations for Other Services    Frequency 7X/week   Plan Discharge plan remains appropriate;Frequency remains appropriate    Precautions / Restrictions Precautions Precautions: Fall Precaution Comments: pt lost her balance when opening bathroom door. encouraged pt to call for assistance for safety.Informed nursing to encourage pt to call for assist.   Pertinent Vitals/Pain 4/10 burning.    Mobility  Bed Mobility Bed Mobility: Supine to Sit;Sit to Supine Supine to Sit: 5: Supervision Sit to Supine: 4: Min guard Details for Bed Mobility Assistance: pt practiced x2 to get in and out of high bed. pt instructed in use of leg lifter which provided pt w/ control of getting leg onto bed. Also use of step stool for eas to scoot back onto bed. Transfers Transfers: Sit to Stand;Stand to Sit Sit to Stand: 5: Supervision;4: Min guard;From bed;With armrests;From chair/3-in-1;With upper extremity assist Stand to Sit: 5: Supervision;To elevated surface;To bed;With upper extremity assist;4: Min guard Details for Transfer Assistance: practiced sit/stand w/ very high bed using foot stool to push self back onto bed.pt required frequent cues for safety as pt would step away from RW. suggested pt sleep on lower bed for first nights.at times L hip is  "floppy". Pt lost her balance   when she opened bathroom door. PT required to steady her balance. Ambulation/Gait Ambulation/Gait Assistance: 4: Min guard Ambulation Distance (Feet): 150 Feet Assistive device: Rolling walker Ambulation/Gait Assistance Details: Improved advancement of LLE during swing.    Exercises Total Joint Exercises Short Arc Quad: AROM;Left;10 reps Heel Slides: AAROM;Left;10 reps Hip ABduction/ADduction: AAROM;Left;10 reps   PT Diagnosis:    PT Problem List:   PT Treatment Interventions:     PT Goals Acute Rehab PT Goals Pt will go Supine/Side to Sit: with supervision PT Goal: Supine/Side to Sit - Progress: Met Pt will go Sit to Supine/Side: with supervision PT Goal: Sit to Supine/Side - Progress: Progressing toward goal Pt will go Sit to Stand: with supervision;with modified independence PT Goal: Sit to Stand - Progress: Updated due to goal met Pt will go Stand to Sit: with modified independence PT Goal: Stand to Sit - Progress: Updated due to goals met Pt will Ambulate: 51 - 150 feet;with supervision PT Goal: Ambulate - Progress: Progressing toward goal Pt will Perform Home Exercise Program: with supervision, verbal cues required/provided PT Goal: Perform Home Exercise Program - Progress: Progressing toward goal  Visit Information  Last PT Received On: 12/02/11 Assistance Needed: +1    Subjective Data  Subjective: i got up to the bathroom by myself.   Cognition  Overall Cognitive Status: Appears within functional limits for tasks assessed/performed Arousal/Alertness: Awake/alert Orientation Level: Appears intact for tasks assessed Behavior During Session: [facility] for tasks performed    Balance  Balance Balance Assessed: Yes  End  of Session PT - End of Session Activity Tolerance: Patient tolerated treatment well Patient left: in bed;with call bell/phone within reach Nurse Communication: Mobility status (pt's loss of balance. pt requires assiostance  when up in roo)    [place], Jobe Igo 12/02/2011, 1:07 PM

## 2011-12-02 NOTE — Progress Notes (Signed)
Subjective: 3 Days Post-Op Procedure(s) (LRB): TOTAL HIP ARTHROPLASTY ANTERIOR APPROACH (Left) Patient reports pain as mild.  Asymptomatic acute blood loss anemia  Objective: Vital signs in last 24 hours: Temp:  [98.7 F (37.1 C)-100.2 F (37.9 C)] 98.7 F (37.1 C) (05/20 0507) Pulse Rate:  [60-66] 60  (05/20 0507) Resp:  [16] 16  (05/20 0507) BP: (107-123)/(64-67) 111/64 mmHg (05/20 0507) SpO2:  [90 %-99 %] 90 % (05/20 0507)  Intake/Output from previous day: 05/19 0701 - 05/20 0700 In: 720 [P.O.:720] Out: 500 [Urine:500] Intake/Output this shift: Total I/O In: -  Out: 300 [Urine:300]   Basename 12/02/11 0424 12/01/11 0526 11/30/11 0400  HGB 9.0* 9.5* 10.0*    Basename 12/02/11 0424 12/01/11 0526  WBC 8.7 9.5  RBC 2.87* 2.96*  HCT 26.0* 26.6*  PLT 123* 135*    Basename 11/30/11 0400  NA 138  K 2.7*  CL 101  CO2 30  BUN 7  CREATININE 0.59  GLUCOSE 107*  CALCIUM 8.4   No results found for this basename: LABPT:2,INR:2 in the last 72 hours  Sensation intact distally Intact pulses distally Dorsiflexion/Plantar flexion intact Incision: no drainage  Assessment/Plan: 3 Days Post-Op Procedure(s) (LRB): TOTAL HIP ARTHROPLASTY ANTERIOR APPROACH (Left) Plan for discharge tomorrow One more day of PT here before D/C to home tomorrow 5/21.  Julie [name] Y 12/02/2011, 6:12 AM

## 2011-12-03 ENCOUNTER — Encounter (HOSPITAL_COMMUNITY): Payer: Self-pay | Admitting: Orthopaedic Surgery

## 2011-12-03 NOTE — Care Management Note (Signed)
    Page 1 of 2   12/03/2011     9:03:21 AM   CARE MANAGEMENT NOTE 12/03/2011  Patient:  Julie [name], Julie [name]   Account Number:  [id number]  Date Initiated:  12/01/2011  Documentation initiated by:  DAVIS,TYMEEKA  Subjective/Objective Assessment:   78 yo female admitted with left total hip replacement. PTA pt lived with boyfriend.     Action/Plan:   Home when stable   Anticipated DC Date:  12/03/2011   Anticipated DC Plan:  HOME W HOME HEALTH SERVICES  In-house referral  Clinical Social Worker      DC Planning Services  CM consult      PAC Choice  DURABLE MEDICAL EQUIPMENT  HOME HEALTH   Choice offered to / List presented to:  [name]   DME arranged  3-N-1  Levan Hurst      DME agency  [facility]     HH arranged  HH-2 PT      [facility] agency  [facility]   Status of service:  Completed, signed off Medicare Important Message given?  NA - LOS <3 / Initial given by admissions (If response is "NO", the following Medicare IM given date fields will be blank) Date Medicare IM given:   Date Additional Medicare IM given:    Discharge Disposition:  HOME W HOME HEALTH SERVICES  Per UR Regulation:  Reviewed for med. necessity/level of care/duration of stay  If discussed at Long Length of Stay Meetings, dates discussed:    Comments:  12/02/2011 Raynelle Bring BSN CCM [phone] Pt plans to go to caregiver's home on [place], [place]. Pt lives in [place] notified that patient will be going to [place] address instaed of [place], address. She currently does not have specifi address but caregiver will be able to provide. Genevieve Norlander will get address. HH services to start day after discharge.   12/01/11 1200 Tymeeka Davis,RN,BSN [phone] Cm spoke with pt concerning dc planning. Pt offered choice of Hh providers for [facility], per pt gentiva referred by MD to provide [facility] services. RW,3n1 delivered to room prior to interview. Pt has  live-in boyfriend whom will assist in home care. No other needs stated.

## 2012-01-29 ENCOUNTER — Telehealth: Payer: Self-pay | Admitting: *Deleted

## 2012-01-29 NOTE — Telephone Encounter (Signed)
Pt had a hip replacement recently, but no LFT's were checked; only BMETs were done. Pt reports she had a physical [facility]. lmom for someone at [facility] to call back.

## 2012-01-29 NOTE — Telephone Encounter (Signed)
Message copied by Florene Glen on Wed Jan 29, 2012  3:08 PM ------      Message from: Harlow Mares D      Created: Fri Dec 27, 2011  2:51 PM                   ----- Message -----         From: Leonette Monarch, CMA         Sent: 01/27/2012           To: Harlow Mares, CMA            lfts see ultrasound from 01/31/2011

## 2012-03-11 ENCOUNTER — Other Ambulatory Visit (HOSPITAL_COMMUNITY): Payer: Self-pay | Admitting: Orthopaedic Surgery

## 2012-03-17 ENCOUNTER — Other Ambulatory Visit: Payer: Self-pay | Admitting: Internal Medicine

## 2012-03-17 DIAGNOSIS — Z1231 Encounter for screening mammogram for malignant neoplasm of breast: Secondary | ICD-10-CM

## 2012-03-25 ENCOUNTER — Encounter (HOSPITAL_COMMUNITY): Payer: Self-pay | Admitting: Pharmacy Technician

## 2012-03-31 ENCOUNTER — Encounter (HOSPITAL_COMMUNITY)
Admission: RE | Admit: 2012-03-31 | Discharge: 2012-03-31 | Disposition: A | Discharge status: Home / Self Care | Payer: Medicare Other | Source: Ambulatory Visit | Attending: Orthopaedic Surgery | Admitting: Orthopaedic Surgery

## 2012-03-31 ENCOUNTER — Encounter (HOSPITAL_COMMUNITY): Payer: Self-pay

## 2012-03-31 HISTORY — DX: Other complications of anesthesia, initial encounter: T88.59XA

## 2012-03-31 HISTORY — DX: Adverse effect of unspecified anesthetic, initial encounter: T41.45XA

## 2012-03-31 LAB — CBC
HCT: 41 % (ref 36.0–46.0)
Hemoglobin: 14.5 g/dL (ref 12.0–15.0)
MCV: 86.7 fL (ref 78.0–100.0)
RBC: 4.73 MIL/uL (ref 3.87–5.11)
RDW: 12.9 % (ref 11.5–15.5)
WBC: 7.3 10*3/uL (ref 4.0–10.5)

## 2012-03-31 LAB — PROTIME-INR
INR: 1.03 (ref 0.00–1.49)
Prothrombin Time: 13.4 seconds (ref 11.6–15.2)

## 2012-03-31 LAB — APTT: aPTT: 35 seconds (ref 24–37)

## 2012-03-31 LAB — BASIC METABOLIC PANEL
CO2: 32 mEq/L (ref 19–32)
Chloride: 99 mEq/L (ref 96–112)
Creatinine, Ser: 0.57 mg/dL (ref 0.50–1.10)
Potassium: 3.4 mEq/L — ABNORMAL LOW (ref 3.5–5.1)

## 2012-03-31 LAB — URINALYSIS, ROUTINE W REFLEX MICROSCOPIC
Bilirubin Urine: NEGATIVE
Glucose, UA: NEGATIVE mg/dL
Specific Gravity, Urine: 1.013 (ref 1.005–1.030)
pH: 6.5 (ref 5.0–8.0)

## 2012-03-31 LAB — TYPE AND SCREEN

## 2012-03-31 LAB — SURGICAL PCR SCREEN: MRSA, PCR: NEGATIVE

## 2012-03-31 NOTE — Telephone Encounter (Signed)
Pt is scheduled for hip revision; will delay for now.

## 2012-03-31 NOTE — Progress Notes (Signed)
EKG requested from [facility]

## 2012-03-31 NOTE — Pre-Procedure Instructions (Signed)
20 Julie [name]  03/31/2012   Your procedure is scheduled on:  Tuesday 04-07-2012  Report to [facility] at 10:30 AM.  Call this number if you have problems the morning of surgery: [phone]   Remember:   Do not eat food or drink:After Midnight .Monday       Take these medicines the morning of surgery with A SIP OF WATER: Amlodipine,levothyroxine,bystolic,   Do not wear jewelry, make-up or nail polish.  Do not wear lotions, powders, or perfumes. You may wear deodorant.  Do not shave 48 hours prior to surgery..  Do not bring valuables to the hospital.  Contacts, dentures or bridgework may not be worn into surgery.  Leave suitcase in the car. After surgery it may be brought to your room.  For patients admitted to the hospital, checkout time is 11:00 AM the day of discharge.      Special Instructions: CHG Shower Use Special Wash: 1/2 bottle night before surgery and 1/2 bottle morning of surgery.   Please read over the following fact sheets that you were given: Pain Booklet, Blood Transfusion Information, MRSA Information and Surgical Site Infection Prevention

## 2012-04-06 MED ORDER — CLINDAMYCIN PHOSPHATE 900 MG/50ML IV SOLN
900.0000 mg | INTRAVENOUS | Status: AC
  Administered 2012-04-07: 900 mg via INTRAVENOUS
  Filled 2012-04-06: qty 50

## 2012-04-07 ENCOUNTER — Encounter (HOSPITAL_COMMUNITY): Payer: Self-pay | Admitting: *Deleted

## 2012-04-07 ENCOUNTER — Encounter (HOSPITAL_COMMUNITY)
Admission: RE | Disposition: A | Discharge status: Home Health | Payer: Self-pay | Source: Ambulatory Visit | Attending: Orthopaedic Surgery

## 2012-04-07 ENCOUNTER — Encounter (HOSPITAL_COMMUNITY): Payer: Self-pay | Admitting: Certified Registered"

## 2012-04-07 ENCOUNTER — Inpatient Hospital Stay (HOSPITAL_COMMUNITY)
Admission: RE | Admit: 2012-04-07 | Discharge: 2012-04-09 | DRG: 468 | Disposition: A | Discharge status: Home Health | Payer: Medicare Other | Source: Ambulatory Visit | Attending: Orthopaedic Surgery | Admitting: Orthopaedic Surgery

## 2012-04-07 ENCOUNTER — Inpatient Hospital Stay (HOSPITAL_COMMUNITY): Payer: Medicare Other | Admitting: Certified Registered"

## 2012-04-07 ENCOUNTER — Inpatient Hospital Stay (HOSPITAL_COMMUNITY): Payer: Medicare Other

## 2012-04-07 DIAGNOSIS — Z882 Allergy status to sulfonamides status: Secondary | ICD-10-CM

## 2012-04-07 DIAGNOSIS — I10 Essential (primary) hypertension: Secondary | ICD-10-CM | POA: Diagnosis present

## 2012-04-07 DIAGNOSIS — E785 Hyperlipidemia, unspecified: Secondary | ICD-10-CM | POA: Diagnosis present

## 2012-04-07 DIAGNOSIS — T84039A Mechanical loosening of unspecified internal prosthetic joint, initial encounter: Principal | ICD-10-CM | POA: Diagnosis present

## 2012-04-07 DIAGNOSIS — Y92009 Unspecified place in unspecified non-institutional (private) residence as the place of occurrence of the external cause: Secondary | ICD-10-CM

## 2012-04-07 DIAGNOSIS — F411 Generalized anxiety disorder: Secondary | ICD-10-CM | POA: Diagnosis present

## 2012-04-07 DIAGNOSIS — Z96649 Presence of unspecified artificial hip joint: Secondary | ICD-10-CM

## 2012-04-07 DIAGNOSIS — Z01812 Encounter for preprocedural laboratory examination: Secondary | ICD-10-CM

## 2012-04-07 DIAGNOSIS — K5730 Diverticulosis of large intestine without perforation or abscess without bleeding: Secondary | ICD-10-CM | POA: Diagnosis present

## 2012-04-07 DIAGNOSIS — Z79899 Other long term (current) drug therapy: Secondary | ICD-10-CM

## 2012-04-07 DIAGNOSIS — Z7982 Long term (current) use of aspirin: Secondary | ICD-10-CM

## 2012-04-07 DIAGNOSIS — Z803 Family history of malignant neoplasm of breast: Secondary | ICD-10-CM

## 2012-04-07 DIAGNOSIS — E039 Hypothyroidism, unspecified: Secondary | ICD-10-CM | POA: Diagnosis present

## 2012-04-07 DIAGNOSIS — Y831 Surgical operation with implant of artificial internal device as the cause of abnormal reaction of the patient, or of later complication, without mention of misadventure at the time of the procedure: Secondary | ICD-10-CM | POA: Diagnosis present

## 2012-04-07 DIAGNOSIS — T84038A Mechanical loosening of other internal prosthetic joint, initial encounter: Secondary | ICD-10-CM

## 2012-04-07 DIAGNOSIS — K219 Gastro-esophageal reflux disease without esophagitis: Secondary | ICD-10-CM | POA: Diagnosis present

## 2012-04-07 DIAGNOSIS — Z8049 Family history of malignant neoplasm of other genital organs: Secondary | ICD-10-CM

## 2012-04-07 DIAGNOSIS — M810 Age-related osteoporosis without current pathological fracture: Secondary | ICD-10-CM | POA: Diagnosis present

## 2012-04-07 DIAGNOSIS — Z801 Family history of malignant neoplasm of trachea, bronchus and lung: Secondary | ICD-10-CM

## 2012-04-07 DIAGNOSIS — M169 Osteoarthritis of hip, unspecified: Secondary | ICD-10-CM | POA: Diagnosis present

## 2012-04-07 DIAGNOSIS — Z880 Allergy status to penicillin: Secondary | ICD-10-CM

## 2012-04-07 DIAGNOSIS — M1610 Unilateral primary osteoarthritis, unspecified hip: Secondary | ICD-10-CM | POA: Diagnosis present

## 2012-04-07 HISTORY — PX: TOTAL HIP REVISION: SHX763

## 2012-04-07 LAB — GRAM STAIN

## 2012-04-07 SURGERY — TOTAL HIP REVISION
Anesthesia: General | Site: Hip | Laterality: Right | Wound class: Clean

## 2012-04-07 MED ORDER — DENOSUMAB 60 MG/ML ~~LOC~~ SOLN: 60.0000 mg | SUBCUTANEOUS | Status: DC

## 2012-04-07 MED ORDER — KETOROLAC TROMETHAMINE 15 MG/ML IJ SOLN
7.5000 mg | Freq: Four times a day (QID) | INTRAMUSCULAR | Status: AC
  Administered 2012-04-07 – 2012-04-08 (×3): 7.5 mg via INTRAVENOUS
  Filled 2012-04-07 (×4): qty 1

## 2012-04-07 MED ORDER — OXYCODONE HCL 5 MG PO TABS
5.0000 mg | ORAL_TABLET | ORAL | Status: DC | PRN
  Administered 2012-04-08 – 2012-04-09 (×3): 10 mg via ORAL
  Filled 2012-04-07 (×3): qty 2

## 2012-04-07 MED ORDER — ACETAMINOPHEN 650 MG RE SUPP: 650.0000 mg | Freq: Four times a day (QID) | RECTAL | Status: DC | PRN

## 2012-04-07 MED ORDER — PROPOFOL 10 MG/ML IV BOLUS
INTRAVENOUS | Status: DC | PRN
  Administered 2012-04-07: 100 mg via INTRAVENOUS

## 2012-04-07 MED ORDER — MENTHOL 3 MG MT LOZG
1.0000 | LOZENGE | OROMUCOSAL | Status: DC | PRN
  Administered 2012-04-08: 3 mg via ORAL
  Filled 2012-04-07: qty 9

## 2012-04-07 MED ORDER — AMLODIPINE BESYLATE 5 MG PO TABS
5.0000 mg | ORAL_TABLET | Freq: Every day | ORAL | Status: DC
  Administered 2012-04-08 – 2012-04-09 (×2): 5 mg via ORAL
  Filled 2012-04-07 (×3): qty 1

## 2012-04-07 MED ORDER — FERROUS SULFATE 325 (65 FE) MG PO TABS
325.0000 mg | ORAL_TABLET | Freq: Three times a day (TID) | ORAL | Status: DC
  Administered 2012-04-07 – 2012-04-09 (×6): 325 mg via ORAL
  Filled 2012-04-07 (×8): qty 1

## 2012-04-07 MED ORDER — ZOLPIDEM TARTRATE 5 MG PO TABS: 5.0000 mg | ORAL_TABLET | Freq: Every evening | ORAL | Status: DC | PRN

## 2012-04-07 MED ORDER — NEBIVOLOL HCL 5 MG PO TABS
5.0000 mg | ORAL_TABLET | Freq: Every day | ORAL | Status: DC
  Administered 2012-04-08 – 2012-04-09 (×2): 5 mg via ORAL
  Filled 2012-04-07 (×2): qty 1

## 2012-04-07 MED ORDER — METHOCARBAMOL 500 MG PO TABS
500.0000 mg | ORAL_TABLET | Freq: Four times a day (QID) | ORAL | Status: DC | PRN
  Filled 2012-04-07: qty 1

## 2012-04-07 MED ORDER — CALCIUM CARBONATE-VITAMIN D 500-200 MG-UNIT PO TABS
1.0000 | ORAL_TABLET | Freq: Two times a day (BID) | ORAL | Status: DC
  Administered 2012-04-07 – 2012-04-09 (×4): 1 via ORAL
  Filled 2012-04-07 (×5): qty 1

## 2012-04-07 MED ORDER — METHOCARBAMOL 100 MG/ML IJ SOLN
500.0000 mg | Freq: Four times a day (QID) | INTRAVENOUS | Status: DC | PRN
  Administered 2012-04-07: 500 mg via INTRAVENOUS
  Filled 2012-04-07: qty 5

## 2012-04-07 MED ORDER — EPHEDRINE SULFATE 50 MG/ML IJ SOLN
INTRAMUSCULAR | Status: DC | PRN
  Administered 2012-04-07: 10 mg via INTRAVENOUS
  Administered 2012-04-07 (×2): 5 mg via INTRAVENOUS

## 2012-04-07 MED ORDER — ALUM & MAG HYDROXIDE-SIMETH 200-200-20 MG/5ML PO SUSP: 30.0000 mL | ORAL | Status: DC | PRN

## 2012-04-07 MED ORDER — ONDANSETRON HCL 4 MG/2ML IJ SOLN: 4.0000 mg | Freq: Four times a day (QID) | INTRAMUSCULAR | Status: DC | PRN

## 2012-04-07 MED ORDER — ALPRAZOLAM 0.5 MG PO TABS
0.5000 mg | ORAL_TABLET | Freq: Every day | ORAL | Status: DC
  Administered 2012-04-07 – 2012-04-08 (×2): 0.5 mg via ORAL
  Filled 2012-04-07 (×2): qty 1

## 2012-04-07 MED ORDER — ROCURONIUM BROMIDE 100 MG/10ML IV SOLN
INTRAVENOUS | Status: DC | PRN
  Administered 2012-04-07: 50 mg via INTRAVENOUS
  Administered 2012-04-07: 15 mg via INTRAVENOUS

## 2012-04-07 MED ORDER — MIDAZOLAM HCL 5 MG/5ML IJ SOLN
INTRAMUSCULAR | Status: DC | PRN
  Administered 2012-04-07: 2 mg via INTRAVENOUS

## 2012-04-07 MED ORDER — ACETAMINOPHEN 325 MG PO TABS: 650.0000 mg | ORAL_TABLET | Freq: Four times a day (QID) | ORAL | Status: DC | PRN

## 2012-04-07 MED ORDER — CLINDAMYCIN PHOSPHATE 600 MG/50ML IV SOLN
600.0000 mg | Freq: Four times a day (QID) | INTRAVENOUS | Status: AC
  Administered 2012-04-07 – 2012-04-08 (×2): 600 mg via INTRAVENOUS
  Filled 2012-04-07 (×2): qty 50

## 2012-04-07 MED ORDER — ADULT MULTIVITAMIN W/MINERALS CH
1.0000 | ORAL_TABLET | Freq: Every day | ORAL | Status: DC
  Administered 2012-04-08 – 2012-04-09 (×2): 1 via ORAL
  Filled 2012-04-07 (×2): qty 1

## 2012-04-07 MED ORDER — ONDANSETRON HCL 4 MG PO TABS: 4.0000 mg | ORAL_TABLET | Freq: Four times a day (QID) | ORAL | Status: DC | PRN

## 2012-04-07 MED ORDER — CALCIUM CARBONATE-VITAMIN D 600-400 MG-UNIT PO TABS: 1.0000 | ORAL_TABLET | Freq: Two times a day (BID) | ORAL | Status: DC

## 2012-04-07 MED ORDER — ASPIRIN EC 325 MG PO TBEC
325.0000 mg | DELAYED_RELEASE_TABLET | Freq: Two times a day (BID) | ORAL | Status: DC
  Administered 2012-04-08 – 2012-04-09 (×4): 325 mg via ORAL
  Filled 2012-04-07 (×5): qty 1

## 2012-04-07 MED ORDER — DOCUSATE SODIUM 100 MG PO CAPS
100.0000 mg | ORAL_CAPSULE | Freq: Two times a day (BID) | ORAL | Status: DC
  Administered 2012-04-07 – 2012-04-09 (×4): 100 mg via ORAL
  Filled 2012-04-07 (×5): qty 1

## 2012-04-07 MED ORDER — HYDROMORPHONE HCL PF 1 MG/ML IJ SOLN
INTRAMUSCULAR | Status: AC
  Filled 2012-04-07: qty 1

## 2012-04-07 MED ORDER — SODIUM CHLORIDE 0.9 % IR SOLN
Status: DC | PRN
  Administered 2012-04-07: 3000 mL

## 2012-04-07 MED ORDER — FENTANYL CITRATE 0.05 MG/ML IJ SOLN
INTRAMUSCULAR | Status: DC | PRN
  Administered 2012-04-07 (×2): 50 ug via INTRAVENOUS
  Administered 2012-04-07: 100 ug via INTRAVENOUS
  Administered 2012-04-07: 50 ug via INTRAVENOUS

## 2012-04-07 MED ORDER — LEVOTHYROXINE SODIUM 50 MCG PO TABS
50.0000 ug | ORAL_TABLET | Freq: Every day | ORAL | Status: DC
  Administered 2012-04-08 – 2012-04-09 (×2): 50 ug via ORAL
  Filled 2012-04-07 (×3): qty 1

## 2012-04-07 MED ORDER — SODIUM CHLORIDE 0.9 % IV SOLN
INTRAVENOUS | Status: DC
  Administered 2012-04-07 – 2012-04-08 (×2): via INTRAVENOUS

## 2012-04-07 MED ORDER — DIPHENHYDRAMINE HCL 12.5 MG/5ML PO ELIX: 12.5000 mg | ORAL_SOLUTION | ORAL | Status: DC | PRN

## 2012-04-07 MED ORDER — METOCLOPRAMIDE HCL 5 MG/ML IJ SOLN: 5.0000 mg | Freq: Three times a day (TID) | INTRAMUSCULAR | Status: DC | PRN

## 2012-04-07 MED ORDER — ESTRADIOL 0.1 MG/GM VA CREA
2.0000 g | TOPICAL_CREAM | VAGINAL | Status: DC | PRN
  Filled 2012-04-07: qty 42.5

## 2012-04-07 MED ORDER — HYDROCHLOROTHIAZIDE 25 MG PO TABS
25.0000 mg | ORAL_TABLET | Freq: Every day | ORAL | Status: DC
  Administered 2012-04-08 – 2012-04-09 (×2): 25 mg via ORAL
  Filled 2012-04-07 (×2): qty 1

## 2012-04-07 MED ORDER — LIDOCAINE HCL (CARDIAC) 20 MG/ML IV SOLN
INTRAVENOUS | Status: DC | PRN
  Administered 2012-04-07: 100 mg via INTRAVENOUS

## 2012-04-07 MED ORDER — GLYCOPYRROLATE 0.2 MG/ML IJ SOLN
INTRAMUSCULAR | Status: DC | PRN
  Administered 2012-04-07: 0.2 mg via INTRAVENOUS
  Administered 2012-04-07: .5 mg via INTRAVENOUS

## 2012-04-07 MED ORDER — POLYVINYL ALCOHOL 1.4 % OP SOLN
1.0000 [drp] | OPHTHALMIC | Status: DC | PRN
  Filled 2012-04-07: qty 15

## 2012-04-07 MED ORDER — PHENOL 1.4 % MT LIQD: 1.0000 | OROMUCOSAL | Status: DC | PRN

## 2012-04-07 MED ORDER — HYDROMORPHONE HCL PF 1 MG/ML IJ SOLN
0.2500 mg | INTRAMUSCULAR | Status: DC | PRN
  Administered 2012-04-07: 0.5 mg via INTRAVENOUS

## 2012-04-07 MED ORDER — NEOSTIGMINE METHYLSULFATE 1 MG/ML IJ SOLN
INTRAMUSCULAR | Status: DC | PRN
  Administered 2012-04-07: 3 mg via INTRAVENOUS

## 2012-04-07 MED ORDER — LACTATED RINGERS IV SOLN
INTRAVENOUS | Status: DC | PRN
  Administered 2012-04-07 (×2): via INTRAVENOUS

## 2012-04-07 MED ORDER — LOSARTAN POTASSIUM-HCTZ 100-25 MG PO TABS: 1.0000 | ORAL_TABLET | Freq: Every day | ORAL | Status: DC

## 2012-04-07 MED ORDER — MORPHINE SULFATE 2 MG/ML IJ SOLN
2.0000 mg | INTRAMUSCULAR | Status: DC | PRN
  Administered 2012-04-07: 2 mg via INTRAVENOUS
  Filled 2012-04-07: qty 1

## 2012-04-07 MED ORDER — ACETAMINOPHEN 10 MG/ML IV SOLN
INTRAVENOUS | Status: DC | PRN
  Administered 2012-04-07: 1000 mg via INTRAVENOUS

## 2012-04-07 MED ORDER — ONDANSETRON HCL 4 MG/2ML IJ SOLN
INTRAMUSCULAR | Status: DC | PRN
  Administered 2012-04-07: 4 mg via INTRAVENOUS

## 2012-04-07 MED ORDER — METOCLOPRAMIDE HCL 5 MG PO TABS
5.0000 mg | ORAL_TABLET | Freq: Three times a day (TID) | ORAL | Status: DC | PRN
  Filled 2012-04-07: qty 2

## 2012-04-07 MED ORDER — LOSARTAN POTASSIUM 50 MG PO TABS
100.0000 mg | ORAL_TABLET | Freq: Every day | ORAL | Status: DC
  Administered 2012-04-08 – 2012-04-09 (×2): 100 mg via ORAL
  Filled 2012-04-07 (×2): qty 2

## 2012-04-07 SURGICAL SUPPLY — 54 items
ACETAB CUP W/GRIPTION 54 (Plate) ×2 IMPLANT
BALL HIP ARTICU EZE 36 8.5 (Hips) IMPLANT
BLADE SURG ROTATE 9660 (MISCELLANEOUS) IMPLANT
BRUSH FEMORAL CANAL (MISCELLANEOUS) IMPLANT
CLOTH BEACON ORANGE TIMEOUT ST (SAFETY) ×2 IMPLANT
COVER BACK TABLE 24X17X13 BIG (DRAPES) IMPLANT
COVER SURGICAL LIGHT HANDLE (MISCELLANEOUS) ×2 IMPLANT
CUP ACETAB W/GRIPTION 54 (Plate) IMPLANT
DRAPE HIP W/POCKET STRL (DRAPE) ×2 IMPLANT
DRAPE INCISE IOBAN 66X45 STRL (DRAPES) IMPLANT
DRAPE INCISE IOBAN 85X60 (DRAPES) ×4 IMPLANT
DRAPE U-SHAPE 47X51 STRL (DRAPES) ×2 IMPLANT
DRILL BIT 7/64X5 (BIT) ×2 IMPLANT
DRSG MEPILEX BORDER 4X12 (GAUZE/BANDAGES/DRESSINGS) ×1 IMPLANT
DRSG MEPILEX BORDER 4X8 (GAUZE/BANDAGES/DRESSINGS) ×1 IMPLANT
DURAPREP 26ML APPLICATOR (WOUND CARE) ×2 IMPLANT
ELECT BLADE 6.5 EXT (BLADE) IMPLANT
ELECT CAUTERY BLADE 6.4 (BLADE) ×2 IMPLANT
ELECT REM PT RETURN 9FT ADLT (ELECTROSURGICAL) ×2
ELECTRODE REM PT RTRN 9FT ADLT (ELECTROSURGICAL) ×1 IMPLANT
ELIMINATOR HOLE APEX DEPUY (Hips) ×1 IMPLANT
EVACUATOR 1/8 PVC DRAIN (DRAIN) IMPLANT
GLOVE BIOGEL PI IND STRL 8 (GLOVE) ×1 IMPLANT
GLOVE BIOGEL PI INDICATOR 8 (GLOVE) ×1
GLOVE ORTHO TXT STRL SZ7.5 (GLOVE) ×2 IMPLANT
GOWN PREVENTION PLUS LG XLONG (DISPOSABLE) IMPLANT
GOWN PREVENTION PLUS XLARGE (GOWN DISPOSABLE) ×2 IMPLANT
GOWN STRL NON-REIN LRG LVL3 (GOWN DISPOSABLE) ×4 IMPLANT
HANDPIECE INTERPULSE COAX TIP (DISPOSABLE)
HIP BALL ARTICU EZE 36 8.5 (Hips) ×2 IMPLANT
KIT BASIN OR (CUSTOM PROCEDURE TRAY) ×2 IMPLANT
KIT ROOM TURNOVER OR (KITS) ×2 IMPLANT
MANIFOLD NEPTUNE II (INSTRUMENTS) ×2 IMPLANT
NS IRRIG 1000ML POUR BTL (IV SOLUTION) ×2 IMPLANT
PACK TOTAL JOINT (CUSTOM PROCEDURE TRAY) ×2 IMPLANT
PAD ARMBOARD 7.5X6 YLW CONV (MISCELLANEOUS) ×4 IMPLANT
PASSER SUT SWANSON 36MM LOOP (INSTRUMENTS) ×2 IMPLANT
Pinnacle Altrx Polyethylene Liner, +4 Neutral 36mm ×1 IMPLANT
SCREW PINN CAN 6.5X20 (Screw) ×2 IMPLANT
SET HNDPC FAN SPRY TIP SCT (DISPOSABLE) IMPLANT
SPONGE LAP 18X18 X RAY DECT (DISPOSABLE) ×2 IMPLANT
SPONGE LAP 4X18 X RAY DECT (DISPOSABLE) IMPLANT
STAPLER VISISTAT 35W (STAPLE) ×2 IMPLANT
SUCTION FRAZIER TIP 10 FR DISP (SUCTIONS) ×2 IMPLANT
SUT ETHIBOND NAB CT1 #1 30IN (SUTURE) ×4 IMPLANT
SUT TICRON (SUTURE) ×4 IMPLANT
SUT VIC AB 1 CTB1 27 (SUTURE) ×4 IMPLANT
SUT VIC AB 2-0 CT1 27 (SUTURE) ×4
SUT VIC AB 2-0 CT1 TAPERPNT 27 (SUTURE) ×2 IMPLANT
TOWEL OR 17X24 6PK STRL BLUE (TOWEL DISPOSABLE) ×2 IMPLANT
TOWEL OR 17X26 10 PK STRL BLUE (TOWEL DISPOSABLE) ×2 IMPLANT
TOWER CARTRIDGE SMART MIX (DISPOSABLE) IMPLANT
TRAY FOLEY CATH 14FR (SET/KITS/TRAYS/PACK) IMPLANT
WATER STERILE IRR 1000ML POUR (IV SOLUTION) ×6 IMPLANT

## 2012-04-07 NOTE — H&P (Signed)
Julie [name] is an 78 y.o. female.   Chief Complaint:   Right hip pain HPI:   78 yo female who several years ago underwent a right total hip replacement.  Had done well for a while, but has experienced right hip pain in her groin.  He metal ion levels have also elevated.  She has a recalled ASR acetabular component from DePuy.  Given the metalosis and her increased pain, we are recommending a revision of this component.  The risks are infection, bleeding, bone lose, hip instability, pelvic fracture, etc.  She gives consent to proceed.  Past Medical History  Diagnosis Date  . Anxiety   . Hyperlipidemia   . Hypertension   . Hypothyroidism   . Osteoporosis   . GERD (gastroesophageal reflux disease)   . Gastritis   . Diverticulosis   . Hemorrhoids   . Arthritis     SEVERE OA LEFT HIP  - S/P RIGHT HIP REPLACEMENT / OCCASIONAL BACK AND NECK PAIN  . Complication of anesthesia     difficulty waking up    Past Surgical History  Procedure Date  . Femoral hernia repair   . Vaginal hysterectomy   . Bladder surgery     tacting and sling  . Tonsillectomy   . Bone spurs     BOTH HEELS - NO SURGERY -JUST INJECTIONS  . Total hip arthroplasty 11/29/2011    Procedure: TOTAL HIP ARTHROPLASTY ANTERIOR APPROACH;  Surgeon: Kathryne Hitch, MD;  Location: [facility];  Service: Orthopedics;  Laterality: Left;  . Tonsillectomy   . Joint replacement     hip replacements times 2  . Abdominal hysterectomy     Family History  Problem Relation Age of Onset  . Uterine cancer Sister   . Breast cancer Sister   . Lung cancer Brother   . Cirrhosis Brother     drinker  . Alzheimer's disease Brother   . Colon cancer Neg Hx    Social History:  reports that she has never smoked. She has never used smokeless tobacco. She reports that she does not drink alcohol or use illicit drugs.  Allergies:  Allergies  Allergen Reactions  . Penicillins Cross Reactors Rash  . Sulfa Drugs Cross Reactors Rash     Medications Prior to Admission  Medication Sig Dispense Refill  . ALPRAZolam (XANAX) 0.5 MG tablet Take 0.5 mg by mouth at bedtime.       Marland Kitchen amLODipine (NORVASC) 5 MG tablet Take 5 mg by mouth daily with breakfast.       . Calcium Carbonate-Vitamin D 600-400 MG-UNIT per tablet Take 1 tablet by mouth 2 (two) times daily.       Marland Kitchen denosumab (PROLIA) 60 MG/ML SOLN Inject 60 mg into the skin every 6 (six) months.       Marland Kitchen estradiol (ESTRACE) 0.1 MG/GM vaginal cream Place 2 g vaginally See admin instructions. One to two times weekly      . levothyroxine (SYNTHROID, LEVOTHROID) 50 MCG tablet Take 50 mcg by mouth daily with breakfast.       . losartan-hydrochlorothiazide (HYZAAR) 100-25 MG per tablet Take 1 tablet by mouth daily with breakfast.       . Multiple Vitamin (MULTIVITAMIN WITH MINERALS) TABS Take 1 tablet by mouth daily.      . nebivolol (BYSTOLIC) 10 MG tablet Take 5 mg by mouth daily.       . Omega-3 Fatty Acids (FISH OIL) 1000 MG CAPS Take 1 capsule by mouth daily.       Marland Kitchen  polyethylene glycol powder (MIRALAX) powder Take 17 g by mouth daily as needed. For constipation      . polyvinyl alcohol (LIQUIFILM TEARS) 1.4 % ophthalmic solution Place 1 drop into both eyes as needed.      . rivaroxaban (XARELTO) 10 MG TABS tablet Take 1 tablet (10 mg total) by mouth daily with breakfast.  20 tablet  0    No results found for this or any previous visit (from the past 48 hour(s)). No results found.  Review of Systems  All other systems reviewed and are negative.    Blood pressure 176/79, pulse 43, temperature 98 F (36.7 C), temperature source Oral, resp. rate 18, SpO2 98.00%. Physical Exam  Constitutional: She is oriented to person, place, and time. She appears well-developed and well-nourished.  HENT:  Head: Normocephalic and atraumatic.  Eyes: EOM are normal. Pupils are equal, round, and reactive to light.  Neck: Normal range of motion. Neck supple.  Cardiovascular: Normal rate and  regular rhythm.   Respiratory: Effort normal and breath sounds normal.  GI: Soft. Bowel sounds are normal.  Musculoskeletal:       Right hip: She exhibits tenderness.  Neurological: She is alert and oriented to person, place, and time.  Skin: Skin is warm and dry.  Psychiatric: She has a normal mood and affect.     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 1) to the OR today for a revision of her right hip acetabular component.  Kathryne Hitch 04/07/2012, 12:30 PM

## 2012-04-07 NOTE — Transfer of Care (Signed)
Immediate Anesthesia Transfer of Care Note  Patient: Julie [name]  Procedure(s) Performed: Procedure(s) (LRB) with comments: TOTAL HIP REVISION (Right) - Revision right hip acetabular component  Patient Location: PACU  Anesthesia Type: General  Level of Consciousness: awake, alert  and patient cooperative  Airway & Oxygen Therapy: Patient Spontanous Breathing and Patient connected to nasal cannula oxygen  Post-op Assessment: Report given to PACU RN, Post -op Vital signs reviewed and stable and Patient moving all extremities  Post vital signs: Reviewed and stable  Complications: Bruise in inner corner of R eye and broken blood vessels below R eye from eye tape removal

## 2012-04-07 NOTE — Brief Op Note (Signed)
04/07/2012  3:22 PM  PATIENT:  Julie [name]  78 y.o. female  PRE-OPERATIVE DIAGNOSIS:  Failed right hip acetabular component (ASR)  POST-OPERATIVE DIAGNOSIS:  Failed right hip acetabular component (ASR)  PROCEDURE:  Procedure(s) (LRB) with comments: TOTAL HIP REVISION (Right) - Revision right hip acetabular component  SURGEON:  Surgeon(s) and Role:    * Kathryne Hitch, MD - Primary  PHYSICIAN ASSISTANT:   ASSISTANTS: Maud Deed, PA-C   ANESTHESIA:   general  EBL:  Total I/O In: 1600 [I.V.:1600] Out: 950 [Urine:650; Blood:300]  BLOOD ADMINISTERED:none  DRAINS: none   LOCAL MEDICATIONS USED:  NONE  SPECIMEN:  No Specimen  DISPOSITION OF SPECIMEN:  N/A  COUNTS:  YES  TOURNIQUET:  * No tourniquets in log *  DICTATION: .Other Dictation: Dictation Number [id number]  PLAN OF CARE: Admit to inpatient   PATIENT DISPOSITION:  PACU - hemodynamically stable.   Delay start of Pharmacological VTE agent (>24hrs) due to surgical blood loss or risk of bleeding: no

## 2012-04-07 NOTE — Anesthesia Procedure Notes (Signed)
Procedure Name: Intubation Date/Time: 04/07/2012 12:49 PM Performed by: Rossie Muskrat L Pre-anesthesia Checklist: Patient identified, Emergency Drugs available, Suction available, Timeout performed and Patient being monitored Patient Re-evaluated:Patient Re-evaluated prior to inductionOxygen Delivery Method: Circle system utilized Preoxygenation: Pre-oxygenation with 100% oxygen Intubation Type: IV induction Ventilation: Mask ventilation without difficulty Laryngoscope Size: Miller and 2 Grade View: Grade I Number of attempts: 1 Airway Equipment and Method: Stylet Placement Confirmation: ETT inserted through vocal cords under direct vision,  breath sounds checked- equal and bilateral and positive ETCO2 Secured at: 21 cm Tube secured with: Tape Dental Injury: Teeth and Oropharynx as per pre-operative assessment

## 2012-04-07 NOTE — Anesthesia Postprocedure Evaluation (Signed)
  Anesthesia Post-op Note  Patient: Julie [name]  Procedure(s) Performed: Procedure(s) (LRB) with comments: TOTAL HIP REVISION (Right) - Revision right hip acetabular component  Patient Location: PACU  Anesthesia Type: General  Level of Consciousness: awake  Airway and Oxygen Therapy: Patient Spontanous Breathing  Post-op Pain: mild  Post-op Assessment: Post-op Vital signs reviewed  Post-op Vital Signs: Reviewed  Complications: No apparent anesthesia complications

## 2012-04-07 NOTE — Preoperative (Signed)
Beta Blockers   Reason not to administer Beta Blockers:B blocker taken 04/07/12 @ 0800

## 2012-04-07 NOTE — Anesthesia Preprocedure Evaluation (Addendum)
Anesthesia Evaluation  Patient identified by MRN, date of birth, ID band Patient awake    Reviewed: Allergy & Precautions, H&P , NPO status , Patient's Chart, lab work & pertinent test results, reviewed documented beta blocker date and time   Airway Mallampati: I TM Distance: >3 FB Neck ROM: full    Dental  (+) Chipped Chipped front right tooth:   Pulmonary          Cardiovascular hypertension, Pt. on medications and Pt. on home beta blockers     Neuro/Psych Anxiety    GI/Hepatic GERD-  Medicated and Controlled,  Endo/Other  Hypothyroidism   Renal/GU      Musculoskeletal   Abdominal   Peds  Hematology   Anesthesia Other Findings   Reproductive/Obstetrics                          Anesthesia Physical Anesthesia Plan  ASA: III  Anesthesia Plan: General   Post-op Pain Management:    Induction:   Airway Management Planned:   Additional Equipment:   Intra-op Plan:   Post-operative Plan:   Informed Consent:   Dental Advisory Given  Plan Discussed with:   Anesthesia Plan Comments:        Anesthesia Quick Evaluation

## 2012-04-08 LAB — BASIC METABOLIC PANEL
Calcium: 8.4 mg/dL (ref 8.4–10.5)
Creatinine, Ser: 0.85 mg/dL (ref 0.50–1.10)
GFR calc non Af Amer: 64 mL/min — ABNORMAL LOW (ref >90)
Glucose, Bld: 134 mg/dL — ABNORMAL HIGH (ref 70–99)
Sodium: 143 mEq/L (ref 135–145)

## 2012-04-08 LAB — CBC
Hemoglobin: 10.9 g/dL — ABNORMAL LOW (ref 12.0–15.0)
MCH: 30.3 pg (ref 26.0–34.0)
MCHC: 34.5 g/dL (ref 30.0–36.0)
MCV: 87.8 fL (ref 78.0–100.0)

## 2012-04-08 MED ORDER — WHITE PETROLATUM GEL
Status: AC
  Filled 2012-04-08: qty 5

## 2012-04-08 NOTE — Op Note (Signed)
NAMEYOLTZIN, RANSOM NO.:  [id number]  MEDICAL RECORD NO.:  [id number]  LOCATION:  [id number]                        FACILITY:  [facility]  PHYSICIAN:  Vanita Panda. Magnus Ivan, M.D.DATE OF BIRTH:  01-07-1933  DATE OF PROCEDURE:  04/07/2012 DATE OF DISCHARGE:                              OPERATIVE REPORT   PREOPERATIVE DIAGNOSES: 1. Failed/loose right hip acetabular component (DePuy ASR component). 2. Right hip metallosis.  POSTOPERATIVE DIAGNOSES: 1. Failed/loose right hip acetabular component (DePuy ASR component). 2. Right hip metallosis.  PROCEDURE:  Revision of right hip acetabular component with removal of DePuy ASR component and placement of new acetabular component.  IMPLANTS:  DePuy sector Gription acetabular component size 54, size 36+ 4 neutral polyethylene liner, size 36+ 8.5 metal hip ball.  SURGEON:  Vanita Panda. Magnus Ivan, M.D.  ASSISTANT:  Wende Neighbors, P.A. who was present and assisted throughout the case.  ANESTHESIA:  General.  ANTIBIOTICS:  600 mg IV clindamycin.  BLOOD LOSS:  300 mL.  COMPLICATIONS:  None.  INDICATIONS:  Ms. Julie [name] is a 78 year old female who several years ago underwent a right total hip arthroplasty with the acetabular component being a DePuy ASR component.  She has not had any hip problems at all with that hip until recently when she developed some pain in the groin. Her metal ion levels had increased over last several years, and an MRI did show fluid collection around the hip consistent with metallosis. The x-rays never showed any change of position of the acetabulum, however given her pain, given the metallosis, and increase the metal ions, we recommend she undergo revision of this hip socket.  The risks and benefits of this were explained to her in detail including the risk of blood loss, fracture, bone loss, nerve and vessel injury, and infection.  PROCEDURE DESCRIPTION:  After informed consent was  obtained, appropriate right hip was marked.  She was brought to the operating room and placed supine on the operating table.  General anesthesia was then obtained. She was then turned into a lateral decubitus position with the right operative hip up and hip positioners were placed in the front, back, and padding on the down on operative.  Axillary roll in place and padding of the head, neck, and arms.  Her right hip was then prepped and draped with DuraPrep and sterile drapes including sterile stockinette.  A time- out was called, and she was identified as correct patient, correct right hip.  I then made an incision directly over the previous incision of the greater trochanter and dissected down to the iliotibial band.  The iliotibial band was then divided longitudinally and a Charnley retractor was placed.  I could see where I repaired the gluteus medius and minimus tendons back to the greater trochanter and these were released again and dissected anteriorly.  I did find some evidence of muscle atrophy.  I then opened up the hip capsule and found a large fluid collection.  We did obtain Gram stain and cultures, but that was consistent with metallosis.  I irrigated this out thoroughly and suctioned out, and further divided the hip capsule.  We were then able to dislocate the hip and removed the hip  ball.  I did not find the edge loading her metal debris, however there was definitely signs of metallosis.  Once we removed the femoral head, I assessed the stem and the femoral component was completely intact and not loose at all.  I then used a several different instruments to remove the acetabular component in its entirety and then we did find areas of it being bone ingrown circumferentially. Once this was removed, we then began reaming.  I went down from a low reamer at 44 and 2 mm increments all the way up to 53.  I then chose a 54 grip on acetabular component after trialing and we placed  this component without difficulty.  I placed 2 screws through screw holes to further secure this and placed the apex hole eliminator guide followed by the 36+ 4 neutral polyethylene liner.  We then trialed several hip balls, the 36+ 8.5 gave [place] the most stability with equal leg lengths. Once we got the real metal ball on, I then copiously irrigated all the joint and soft tissue with normal saline solution using pulsatile lavage and 3 L of solution.  I was then able to easily closed the hip capsule with interrupted #1 Ethibond suture.  We reapproximated the remnants of gluteus medius and minimus, and the iliotibial band back to the greater trochanter and over sewed this as well.  We closed the deep tissue 0 Vicryl followed by 2-0 Vicryl in subcutaneous tissues and staples on the skin.  We well-padded sterile dressings applied.  We then turned her back into supine position, and her leg lengths were again felt to be equal.  She was awakened, extubated, and taken to recovery room in stable condition.  All final counts correct and there were no complications noted.     Vanita Panda. Magnus Ivan, M.D.     CYB/MEDQ  D:  04/07/2012  T:  04/08/2012  Job:  [id number]

## 2012-04-08 NOTE — Progress Notes (Signed)
Utilization review completed. Samson Ralph, RN, BSN. 

## 2012-04-08 NOTE — Evaluation (Signed)
Physical Therapy Evaluation Patient Details Name: Julie [name] MRN: [id number] DOB: 22-Sep-1932 Today's Date: 04/08/2012 Time: 1205-1239 PT Time Calculation (min): 34 min  PT Assessment / Plan / Recommendation Clinical Impression  Pt is a 78 y/o female s/p revision of R THA secondary to recall of hardware.  Pt doing well with mobility and should be able to d/c home tomorrow barring setback.     PT Assessment  Patient needs continued PT services    Follow Up Recommendations  Home health PT;Supervision - Intermittent    Barriers to Discharge None      Equipment Recommendations  None recommended by PT    Recommendations for Other Services     Frequency 7X/week    Precautions / Restrictions Precautions Precautions: Anterior Hip Precaution Booklet Issued: Yes (comment) Restrictions Weight Bearing Restrictions: Yes RLE Weight Bearing: Weight bearing as tolerated   Pertinent Vitals/Pain Pain in R hip 3/10 no intervention required.       Mobility  Bed Mobility Bed Mobility: Sit to Supine Sit to Supine: 4: Min assist;HOB flat Details for Bed Mobility Assistance: Assist for R LE  Transfers Transfers: Sit to Stand;Stand to Sit Sit to Stand: 5: Supervision;From chair/3-in-1 Stand to Sit: 5: Supervision;To bed;With upper extremity assist Details for Transfer Assistance: Cues for hand placement and anterior hip precautions.  Ambulation/Gait Ambulation/Gait Assistance: 5: Supervision Ambulation Distance (Feet): 80 Feet Assistive device: Rolling walker Ambulation/Gait Assistance Details: supervision for safety and cues to roll walker instead of lifting it.  Gait Pattern: Step-through pattern Stairs: No Wheelchair Mobility Wheelchair Mobility: No    Shoulder Instructions     Exercises Total Joint Exercises Ankle Circles/Pumps: 10 reps;Both Quad Sets: 10 reps;Both Gluteal Sets: 10 reps;Both Heel Slides: 5 reps;Right;Supine   PT Diagnosis: Abnormality of gait;Acute  pain;Generalized weakness  PT Problem List: Decreased mobility;Decreased strength;Decreased range of motion;Pain;Decreased knowledge of precautions PT Treatment Interventions: Gait training;Stair training;Functional mobility training;Therapeutic exercise;Patient/family education;DME instruction;Therapeutic activities   PT Goals Acute Rehab PT Goals PT Goal Formulation: With patient Time For Goal Achievement: 04/15/12 Potential to Achieve Goals: Good Pt will go Supine/Side to Sit: with supervision PT Goal: Supine/Side to Sit - Progress: Goal set today Pt will go Sit to Supine/Side: with supervision PT Goal: Sit to Supine/Side - Progress: Goal set today Pt will Transfer Bed to Chair/Chair to Bed: with supervision PT Transfer Goal: Bed to Chair/Chair to Bed - Progress: Goal set today Pt will Ambulate: 51 - 150 feet;with supervision;with rolling walker PT Goal: Ambulate - Progress: Goal set today Pt will Go Up / Down Stairs: 3-5 stairs;with supervision;with least restrictive assistive device PT Goal: Up/Down Stairs - Progress: Goal set today Pt will Perform Home Exercise Program: with supervision, verbal cues required/provided PT Goal: Perform Home Exercise Program - Progress: Goal set today  Visit Information  Last PT Received On: 04/08/12    Subjective Data  Subjective: Agree to PT eval Patient Stated Goal:  walk with out pain   Prior Functioning  Home Living Lives With: Alone Available Help at Discharge: Friend(s) Type of Home: House Home Access: Level entry Home Layout: Multi-level;Bed/bath upstairs Alternate Level Stairs-Number of Steps: 13 Bathroom Shower/Tub: Walk-in shower;Door [employer] Toilet: Standard Home Adaptive Equipment: Walker - rolling;Bedside commode/3-in-1;Straight cane Prior Function Level of Independence: Independent Able to Take Stairs?: Yes Communication Communication: No difficulties    Cognition  Overall Cognitive Status: Appears within functional  limits for tasks assessed/performed Arousal/Alertness: Awake/alert Orientation Level: Oriented X4 / Intact Behavior During Session: [facility] for tasks performed  Extremity/Trunk Assessment Right Lower Extremity Assessment RLE ROM/Strength/Tone: Unable to fully assess;Due to precautions;Due to pain Left Lower Extremity Assessment LLE ROM/Strength/Tone: [facility] for tasks assessed Trunk Assessment Trunk Assessment: Normal   Balance    End of Session PT - End of Session Equipment Utilized During Treatment: Gait belt Activity Tolerance: Patient tolerated treatment well Patient left: in bed;with call bell/phone within reach;with family/visitor present Nurse Communication: Mobility status  GP     Karysa Heft 04/08/2012, 1:24 PM  Nachelle Negrette L. Quan Cybulski DPT [phone]

## 2012-04-08 NOTE — Progress Notes (Signed)
Patient ID: Julie [name], female   DOB: Apr 08, 1933, 78 y.o.   MRN: [id number] Up already and eating breakfast.  Appears comfortable.  Afebrile/VSS. Mild pain.  Leg lengths equal.  Right hip dressing with scant drainage.  NVI.  Labs not back yet.  Plan: 1) up with PT/OT - WBAT right leg; anterior hip precautions 2) follow-up with labs.

## 2012-04-09 ENCOUNTER — Encounter (HOSPITAL_COMMUNITY): Payer: Self-pay | Admitting: Orthopaedic Surgery

## 2012-04-09 LAB — CBC
MCH: 30.9 pg (ref 26.0–34.0)
MCHC: 35.3 g/dL (ref 30.0–36.0)
MCV: 87.7 fL (ref 78.0–100.0)
Platelets: 142 10*3/uL — ABNORMAL LOW (ref 150–400)
RBC: 3.17 MIL/uL — ABNORMAL LOW (ref 3.87–5.11)
RDW: 13.7 % (ref 11.5–15.5)

## 2012-04-09 MED ORDER — METHOCARBAMOL 500 MG PO TABS: 500.0000 mg | ORAL_TABLET | Freq: Four times a day (QID) | ORAL | Status: DC | PRN

## 2012-04-09 MED ORDER — ASPIRIN 325 MG PO TBEC: 325.0000 mg | DELAYED_RELEASE_TABLET | Freq: Every day | ORAL | Status: DC

## 2012-04-09 MED ORDER — OXYCODONE-ACETAMINOPHEN 5-325 MG PO TABS: 1.0000 | ORAL_TABLET | ORAL | Status: DC | PRN

## 2012-04-09 NOTE — Progress Notes (Signed)
Physical Therapy Treatment Patient Details Name: Julie [name] MRN: [id number] DOB: 1933/03/28 Today's Date: 04/09/2012 Time: 1003-1050 PT Time Calculation (min): 47 min  PT Assessment / Plan / Recommendation Comments on Treatment Session  Reviewed anterior hip precautions with pt.  Provided pt with handout of hip precautions and HEP.     Follow Up Recommendations  Home health PT;Supervision - Intermittent    Barriers to Discharge        Equipment Recommendations  None recommended by PT    Recommendations for Other Services    Frequency 7X/week   Plan All goals met and education completed, patient dischaged from PT services    Precautions / Restrictions Precautions Precautions: Anterior Hip Precaution Booklet Issued: Yes (comment) Restrictions Weight Bearing Restrictions: Yes RLE Weight Bearing: Weight bearing as tolerated    Pertinent Vitals/Pain Pt reports pain in R hip 2-3/10 no intervention required.     Mobility  Bed Mobility Bed Mobility: Supine to Sit;Sit to Supine Supine to Sit: 6: Modified independent (Device/Increase time) Sit to Supine: 6: Modified independent (Device/Increase time) Transfers Transfers: Sit to Stand;Stand to Sit Sit to Stand: 6: Modified independent (Device/Increase time) Stand to Sit: 6: Modified independent (Device/Increase time) Ambulation/Gait Ambulation/Gait Assistance: 6: Modified independent (Device/Increase time) Ambulation Distance (Feet): 150 Feet Assistive device: Rolling walker Gait Pattern: Step-through pattern Stairs: No Wheelchair Mobility Wheelchair Mobility: No    Exercises Total Joint Exercises Ankle Circles/Pumps: 10 reps;Both Quad Sets: 10 reps;Both Gluteal Sets: 10 reps;Both Short Arc Quad: Right;10 reps;Seated Heel Slides: 5 reps;Right;Supine Marching in Standing: 10 reps;Right   PT Diagnosis:    PT Problem List:   PT Treatment Interventions:     PT Goals Acute Rehab PT Goals PT Goal Formulation:  With patient Time For Goal Achievement: 04/15/12 Potential to Achieve Goals: Good Pt will go Supine/Side to Sit: with supervision PT Goal: Supine/Side to Sit - Progress: Met Pt will go Sit to Supine/Side: with supervision PT Goal: Sit to Supine/Side - Progress: Met Pt will Transfer Bed to Chair/Chair to Bed: with supervision PT Transfer Goal: Bed to Chair/Chair to Bed - Progress: Met Pt will Ambulate: 51 - 150 feet;with supervision;with rolling walker PT Goal: Ambulate - Progress: Met Pt will Go Up / Down Stairs: 3-5 stairs;with supervision;with least restrictive assistive device PT Goal: Up/Down Stairs - Progress: Discontinued (comment) (Pt reporting no stairs at home) Pt will Perform Home Exercise Program: with supervision, verbal cues required/provided PT Goal: Perform Home Exercise Program - Progress: Met  Visit Information  Last PT Received On: 04/09/12    Subjective Data  Subjective: My hip is so sore Patient Stated Goal:  walk with out pain   Cognition  Overall Cognitive Status: Appears within functional limits for tasks assessed/performed Arousal/Alertness: Awake/alert Orientation Level: Oriented X4 / Intact Behavior During Session: [facility] for tasks performed    Balance     End of Session PT - End of Session Equipment Utilized During Treatment: Gait belt Activity Tolerance: Patient tolerated treatment well Patient left: in chair;with call bell/phone within reach;with family/visitor present Nurse Communication: Mobility status   GP     Alizae Bechtel 04/09/2012, 12:20 PM Sayde Lish L. Rayhaan Huster DPT [phone]

## 2012-04-09 NOTE — Evaluation (Signed)
Occupational Therapy Evaluation Patient Details Name: Julie [name] MRN: [id number] DOB: 1932/11/10 Today's Date: 04/09/2012 Time: 4098-1191 OT Time Calculation (min): 20 min  OT Assessment / Plan / Recommendation Clinical Impression  78 yo female s/p anterior Rt THA taht could benefit from skilled OT acutely. Ot to follow. Recommend HHOT    OT Assessment  Patient needs continued OT Services    Follow Up Recommendations  Home health OT    Barriers to Discharge      Equipment Recommendations  None recommended by OT    Recommendations for Other Services    Frequency  Min 2X/week    Precautions / Restrictions Precautions Precautions: Anterior Hip Precaution Comments: provided anterior hip handout- pt unable to recall or state receiving handout previously. Question STM - Pt eval states handout provided Restrictions Weight Bearing Restrictions: Yes RLE Weight Bearing: Weight bearing as tolerated   Pertinent Vitals/Pain None reported    ADL  Grooming: Performed;Wash/dry hands;Wash/dry face;Supervision/safety Where Assessed - Grooming: Unsupported standing Upper Body Bathing: Performed;Chest;Right arm;Left arm;Abdomen;Supervision/safety Where Assessed - Upper Body Bathing: Unsupported standing Lower Body Bathing: Performed;Supervision/safety Where Assessed - Lower Body Bathing: Unsupported standing Upper Body Dressing: Performed;Supervision/safety Where Assessed - Upper Body Dressing: Unsupported standing Toilet Transfer: Simulated;Supervision/safety Toilet Transfer Method: Sit to stand Toilet Transfer Equipment: Raised toilet seat with arms (or 3-in-1 over toilet) Equipment Used: Rolling walker Transfers/Ambulation Related to ADLs: pt completed ambulation to restroom with v/c for RW safety. Pt with no recall of precautions. ADL Comments: husband found in hallway attempting to locate room. pt in room attempting bath independently.     OT Diagnosis: Generalized  weakness;Acute pain  OT Problem List: Decreased strength;Decreased activity tolerance;Impaired balance (sitting and/or standing);Decreased knowledge of use of DME or AE;Decreased knowledge of precautions;Decreased safety awareness;Pain OT Treatment Interventions: Self-care/ADL training;DME and/or AE instruction;Therapeutic activities;Patient/family education;Balance training   OT Goals Acute Rehab OT Goals OT Goal Formulation: With patient/family Time For Goal Achievement: 04/23/12 Potential to Achieve Goals: Good ADL Goals Pt Will Perform Grooming: with modified independence;Standing at sink ADL Goal: Grooming - Progress: Goal set today Pt Will Perform Upper Body Bathing: with modified independence;Standing at sink ADL Goal: Upper Body Bathing - Progress: Goal set today Pt Will Perform Lower Body Bathing: with modified independence;Standing at sink ADL Goal: Lower Body Bathing - Progress: Goal set today Pt Will Perform Upper Body Dressing: with modified independence;Sit to stand from chair ADL Goal: Upper Body Dressing - Progress: Goal set today Pt Will Perform Lower Body Dressing: with modified independence;Sit to stand from chair ADL Goal: Lower Body Dressing - Progress: Goal set today Pt Will Transfer to Toilet: with modified independence;Regular height toilet ADL Goal: Toilet Transfer - Progress: Goal set today Miscellaneous OT Goals Miscellaneous OT Goal #1: Pt will verbalize and demonstrate 100% safe THP anterior with ADls from memory OT Goal: Miscellaneous Goal #1 - Progress: Goal set today  Visit Information  Last OT Received On: 04/09/12 Assistance Needed: +1    Subjective Data  Subjective: "oh normally I just sit on the sink"- pt states as method for peri hygiene. Pts spouse states "yep normally she just sits up there, only person I know to do that" Patient Stated Goal: to go home   Prior Functioning     Home Living Lives With: Alone Available Help at Discharge:  Friend(s) Type of Home: House Home Access: Level entry Home Layout: Multi-level;Bed/bath upstairs Alternate Level Stairs-Number of Steps: 13 Bathroom Shower/Tub: Walk-in shower;Door [employer] Toilet: Standard Home Adaptive Equipment: Dan Humphreys -  rolling;Bedside commode/3-in-1;Straight cane Prior Function Level of Independence: Independent Able to Take Stairs?: Yes Communication Communication: No difficulties         Vision/Perception     Cognition  Overall Cognitive Status: Appears within functional limits for tasks assessed/performed Arousal/Alertness: Awake/alert Orientation Level: Oriented X4 / Intact Behavior During Session: WFL for tasks performed Cognition - Other Comments: STM poor recall of precautions, husband demonstrates difficulty finding room    Extremity/Trunk Assessment Right Upper Extremity Assessment RUE ROM/Strength/Tone: Within functional levels Left Upper Extremity Assessment LUE ROM/Strength/Tone: Within functional levels     Mobility Bed Mobility Sit to Supine: 4: Min assist Details for Bed Mobility Assistance: Assist for R LE  Transfers Transfers: Sit to Stand;Stand to Sit Sit to Stand: 5: Supervision Stand to Sit: 5: Supervision Details for Transfer Assistance: cues for hand placement and hip precautions. Pt attempts to take large steps and wide gait     Shoulder Instructions     Exercise     Balance     End of Session OT - End of Session Activity Tolerance: Patient tolerated treatment well Patient left: in bed;with call bell/phone within reach;with family/visitor present Nurse Communication: Mobility status  Pt educated that picking up great granddaughter is not okay to do at this time. Pt states " I just love her though"    Harrel Carina [facility] 04/09/2012, 3:33 PM Pager: [phone]

## 2012-04-09 NOTE — Discharge Summary (Signed)
Patient ID: Julie [name] MRN: [id number] DOB/AGE: [birth year] 78 y.o.  Admit date: 04/07/2012 Discharge date: 04/09/2012  Admission Diagnoses:  Principal Problem:  *Aseptic loosening of prosthetic hip   Discharge Diagnoses:  Same  Past Medical History  Diagnosis Date  . Anxiety   . Hyperlipidemia   . Hypertension   . Hypothyroidism   . Osteoporosis   . GERD (gastroesophageal reflux disease)   . Gastritis   . Diverticulosis   . Hemorrhoids   . Arthritis     SEVERE OA LEFT HIP  - S/P RIGHT HIP REPLACEMENT / OCCASIONAL BACK AND NECK PAIN  . Complication of anesthesia     difficulty waking up    Surgeries: Procedure(s): TOTAL HIP REVISION on 04/07/2012   Consultants:    Discharged Condition: Improved  Hospital Course: Julie [name] is an 78 y.o. female who was admitted 04/07/2012 for operative treatment ofAseptic loosening of prosthetic hip. Patient has severe unremitting pain that affects sleep, daily activities, and work/hobbies. After pre-op clearance the patient was taken to the operating room on 04/07/2012 and underwent  Procedure(s): TOTAL HIP REVISION.    Patient was given perioperative antibiotics: Anti-infectives     Start     Dose/Rate Route Frequency Ordered Stop   04/07/12 2000   clindamycin (CLEOCIN) IVPB 600 mg        600 mg 100 mL/hr over 30 Minutes Intravenous Every 6 hours 04/07/12 1921 05-May-2012 0252   04/06/12 1503   clindamycin (CLEOCIN) IVPB 900 mg        900 mg 100 mL/hr over 30 Minutes Intravenous 60 min pre-op 04/06/12 1503 04/07/12 1245           Patient was given sequential compression devices, early ambulation, and chemoprophylaxis to prevent DVT.  Patient benefited maximally from hospital stay and there were no complications.    Recent vital signs: Patient Vitals for the past 24 hrs:  BP Temp Pulse Resp SpO2  04/09/12 0600 142/57 mmHg 98.8 F (37.1 C) 65  18  98 %  05-May-2012 2137 143/55 mmHg 99.4 F (37.4 C) 78  18  95 %       Recent laboratory studies:  Basename 04/09/12 0450 05-05-12 0654  WBC 9.4 11.3*  HGB 9.8* 10.9*  HCT 27.8* 31.6*  PLT 142* 171  NA -- 143  K -- 3.6  CL -- 100  CO2 -- 29  BUN -- 20  CREATININE -- 0.85  GLUCOSE -- 134*  INR -- --  CALCIUM -- 8.4     Discharge Medications:     Medication List     As of 04/09/2012  5:47 PM    STOP taking these medications         rivaroxaban 10 MG Tabs tablet   Commonly known as: XARELTO      TAKE these medications         ALPRAZolam 0.5 MG tablet   Commonly known as: XANAX   Take 0.5 mg by mouth at bedtime.      amLODipine 5 MG tablet   Commonly known as: NORVASC   Take 5 mg by mouth daily with breakfast.      aspirin 325 MG EC tablet   Take 1 tablet (325 mg total) by mouth daily.      Calcium Carbonate-Vitamin D 600-400 MG-UNIT per tablet   Take 1 tablet by mouth 2 (two) times daily.      estradiol 0.1 MG/GM vaginal cream   Commonly known as: ESTRACE  Place 2 g vaginally See admin instructions. One to two times weekly      Fish Oil 1000 MG Caps   Take 1 capsule by mouth daily.      levothyroxine 50 MCG tablet   Commonly known as: SYNTHROID, LEVOTHROID   Take 50 mcg by mouth daily with breakfast.      losartan-hydrochlorothiazide 100-25 MG per tablet   Commonly known as: HYZAAR   Take 1 tablet by mouth daily with breakfast.      methocarbamol 500 MG tablet   Commonly known as: ROBAXIN   Take 1 tablet (500 mg total) by mouth every 6 (six) hours as needed.      MIRALAX powder   Generic drug: polyethylene glycol powder   Take 17 g by mouth daily as needed. For constipation      multivitamin with minerals Tabs   Take 1 tablet by mouth daily.      nebivolol 10 MG tablet   Commonly known as: BYSTOLIC   Take 5 mg by mouth daily.      oxyCODONE-acetaminophen 5-325 MG per tablet   Commonly known as: PERCOCET/ROXICET   Take 1-2 tablets by mouth every 4 (four) hours as needed for pain.      polyvinyl alcohol 1.4  % ophthalmic solution   Commonly known as: LIQUIFILM TEARS   Place 1 drop into both eyes as needed.      PROLIA 60 MG/ML Soln injection   Generic drug: denosumab   Inject 60 mg into the skin every 6 (six) months.        Diagnostic Studies: Dg Pelvis Portable  04/07/2012  *RADIOLOGY REPORT*  Clinical Data: Postop right hip.  PORTABLE PELVIS  Comparison: 11/29/2011  Findings: Revision of the right hip replacement.  Remote changes of left hip replacement.  No hardware or bony complicating feature. Diffuse osteopenia.  IMPRESSION: No hardware complicating feature.   Original Report Authenticated By: Cyndie Chime, M.D.     Disposition: 06-Home-Health Care Svc      Discharge Orders    Future Orders Please Complete By Expires   Diet - low sodium heart healthy      Call MD / Call 911      Comments:   If you experience chest pain or shortness of breath, CALL 911 and be transported to the hospital emergency room.  If you develope a fever above 101 F, pus (white drainage) or increased drainage or redness at the wound, or calf pain, call your surgeon's office.   Constipation Prevention      Comments:   Drink plenty of fluids.  Prune juice may be helpful.  You may use a stool softener, such as Colace (over the counter) 100 mg twice a day.  Use MiraLax (over the counter) for constipation as needed.   Increase activity slowly as tolerated      Discharge instructions      Comments:   Keep your incision clean and dry.  Wait 5 days before getting your incision wet in the shower.  Anterior hip precautions.   Discontinue IV      Discharge patient         Follow-up Information    Follow up with Kathryne Hitch, MD. In 2 weeks.   Contact information:   [facility] [address] [place] [address] [phone]           Signed: Kathryne Hitch 04/09/2012, 5:47 PM

## 2012-04-09 NOTE — Progress Notes (Signed)
CARE MANAGEMENT NOTE 04/09/2012  Patient:  Julie [name], Julie [name]   Account Number:  [id number]  Date Initiated:  04/09/2012  Documentation initiated by:  Vance Peper  Subjective/Objective Assessment:   78 yr old female s/p right total hip revision.     Action/Plan:   CM spoke with patient regarding home health and DME needs.Patient states she has rolling walker and 3in1. Has support at discharge.Choice offered.   Anticipated DC Date:  04/09/2012   Anticipated DC Plan:  HOME W HOME HEALTH SERVICES      DC Planning Services  CM consult      [facility] Choice  HOME HEALTH   Choice offered to / List presented to:  [name]        HH arranged  HH-2 PT      Status of service:  Completed, signed off Medicare Important Message given?   (If response is "NO", the following Medicare IM given date fields will be blank) Date Medicare IM given:   Date Additional Medicare IM given:    Discharge Disposition:  HOME W HOME HEALTH SERVICES  Per UR Regulation:    If discussed at Long Length of Stay Meetings, dates discussed:    Comments:

## 2012-04-09 NOTE — Progress Notes (Signed)
Patient ID: Julie [name], female   DOB: [birth year], 78 y.o.   MRN: [id number] Sleeping early this am.  AF/VSS.  Hgb 9.8 (acute blood loss anemia, but asymptomatic).  Good day with therapy yesterday.  Home soon with HHPT.  I'll check on her later today.

## 2012-04-10 LAB — BODY FLUID CULTURE: Culture: NO GROWTH

## 2012-04-12 LAB — ANAEROBIC CULTURE

## 2012-07-10 ENCOUNTER — Ambulatory Visit
Admission: RE | Admit: 2012-07-10 | Discharge: 2012-07-10 | Disposition: A | Discharge status: Home / Self Care | Payer: Medicare Other | Source: Ambulatory Visit | Attending: Internal Medicine | Admitting: Internal Medicine

## 2012-07-10 DIAGNOSIS — Z1231 Encounter for screening mammogram for malignant neoplasm of breast: Secondary | ICD-10-CM

## 2012-08-17 ENCOUNTER — Other Ambulatory Visit (INDEPENDENT_AMBULATORY_CARE_PROVIDER_SITE_OTHER): Payer: Medicare Other

## 2012-08-17 ENCOUNTER — Telehealth: Payer: Self-pay | Admitting: *Deleted

## 2012-08-17 DIAGNOSIS — R945 Abnormal results of liver function studies: Secondary | ICD-10-CM

## 2012-08-17 DIAGNOSIS — R7989 Other specified abnormal findings of blood chemistry: Secondary | ICD-10-CM

## 2012-08-17 LAB — HEPATIC FUNCTION PANEL
AST: 44 U/L — ABNORMAL HIGH (ref 0–37)
Albumin: 4.4 g/dL (ref 3.5–5.2)
Total Bilirubin: 0.5 mg/dL (ref 0.3–1.2)

## 2012-08-17 NOTE — Telephone Encounter (Signed)
Message copied by Florene Glen on Mon Aug 17, 2012  9:00 AM ------      Message from: Harlow Mares D      Created: Fri Dec 27, 2011  2:50 PM                   ----- Message -----         From: Leonette Monarch, CMA         Sent: 08/17/2012           To: Harlow Mares, CMA            Repeat lfts

## 2012-08-17 NOTE — Telephone Encounter (Signed)
Informed pt she needs to repeat her liver tests; she will come in at her convenience.

## 2012-09-24 ENCOUNTER — Other Ambulatory Visit: Payer: Self-pay | Admitting: Gastroenterology

## 2012-09-24 NOTE — Telephone Encounter (Signed)
PATIENT NEEDS OFFICE VISIT FOR FURTHER REFILLS  

## 2012-12-17 IMAGING — CR DG CHEST 2V
2 series · 2 of 2 positions shown · non-contrast
Comparison: 01/18/2008

CLINICAL DATA: Preop left hip arthroplasty

CHEST - 2 VIEW

[w chest pa]
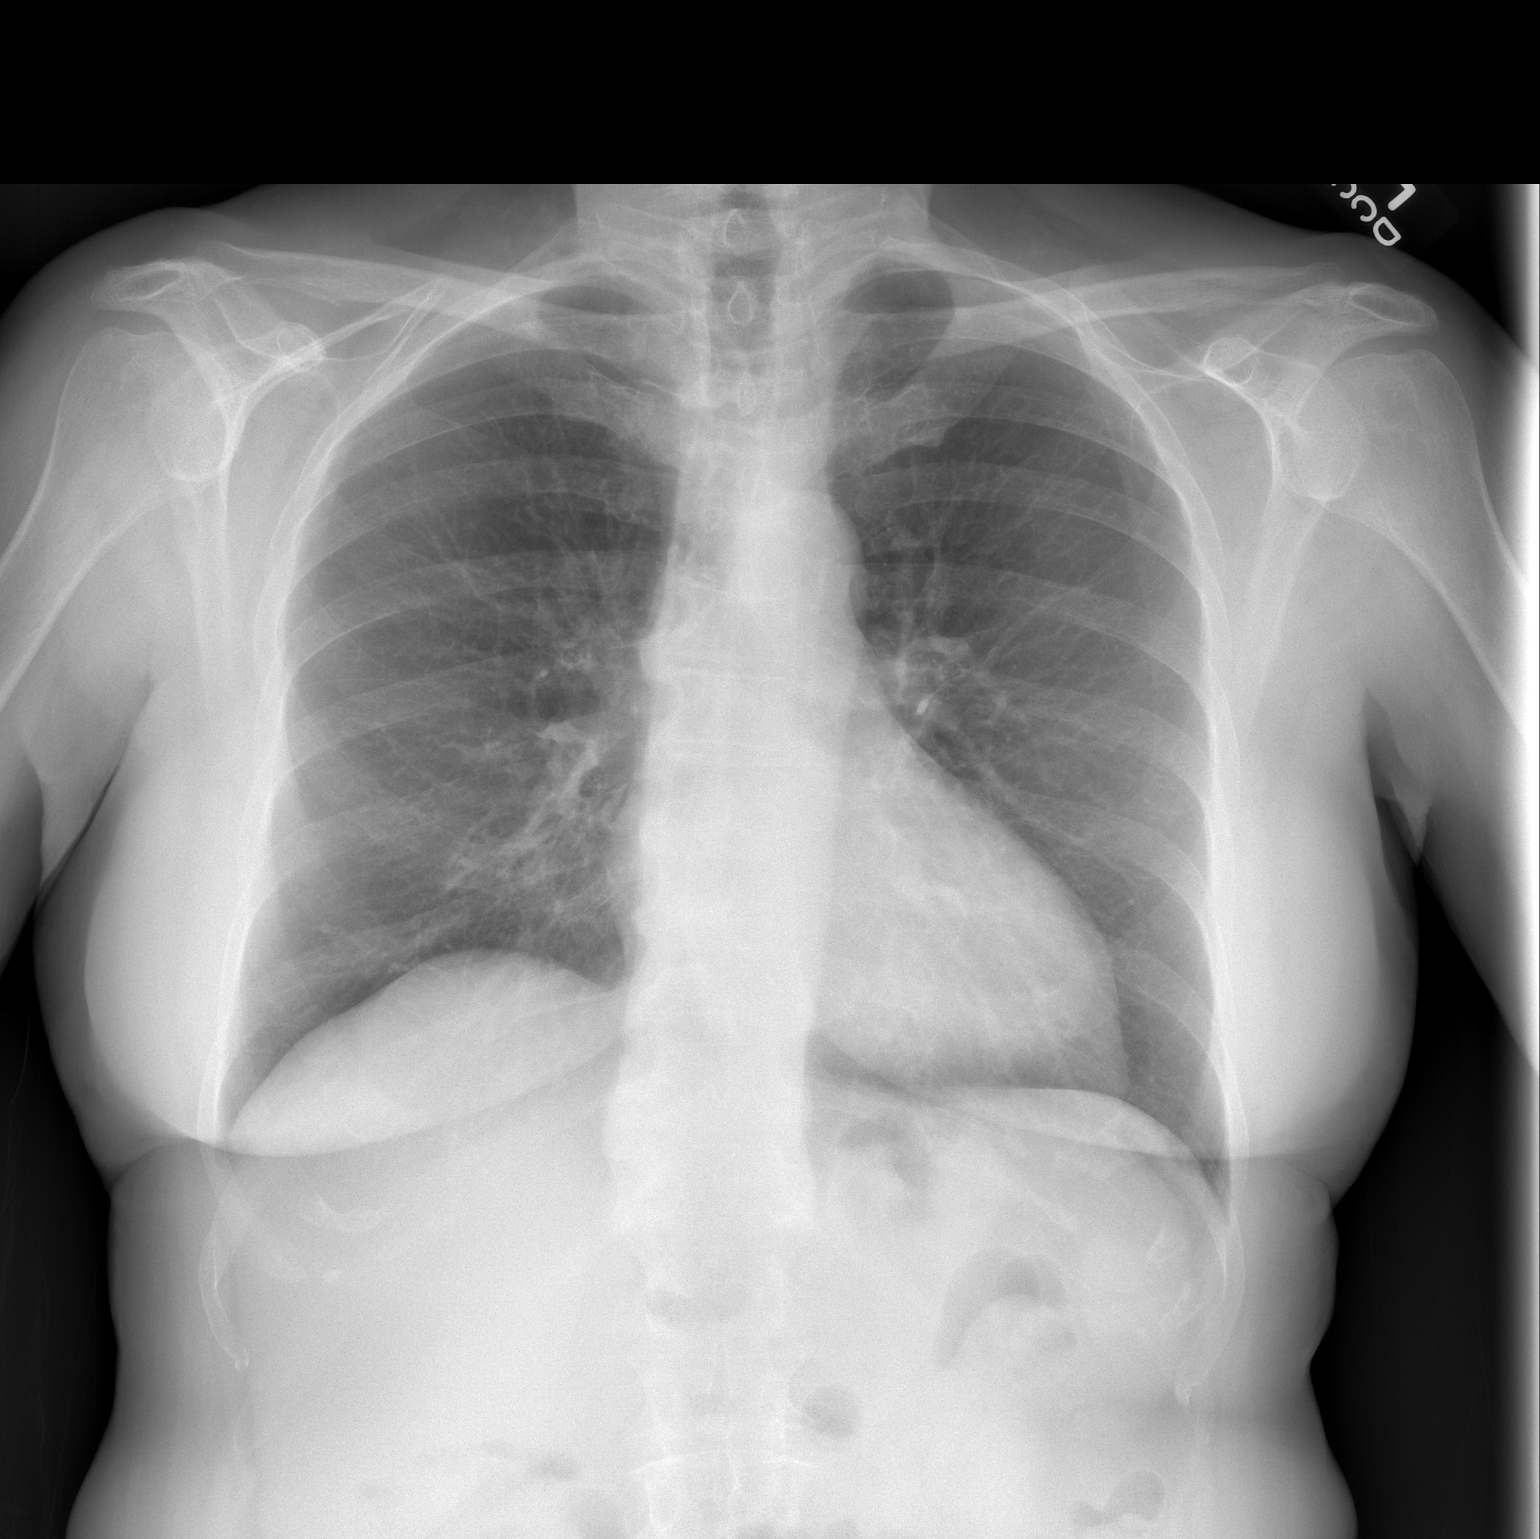

[w chest lat]
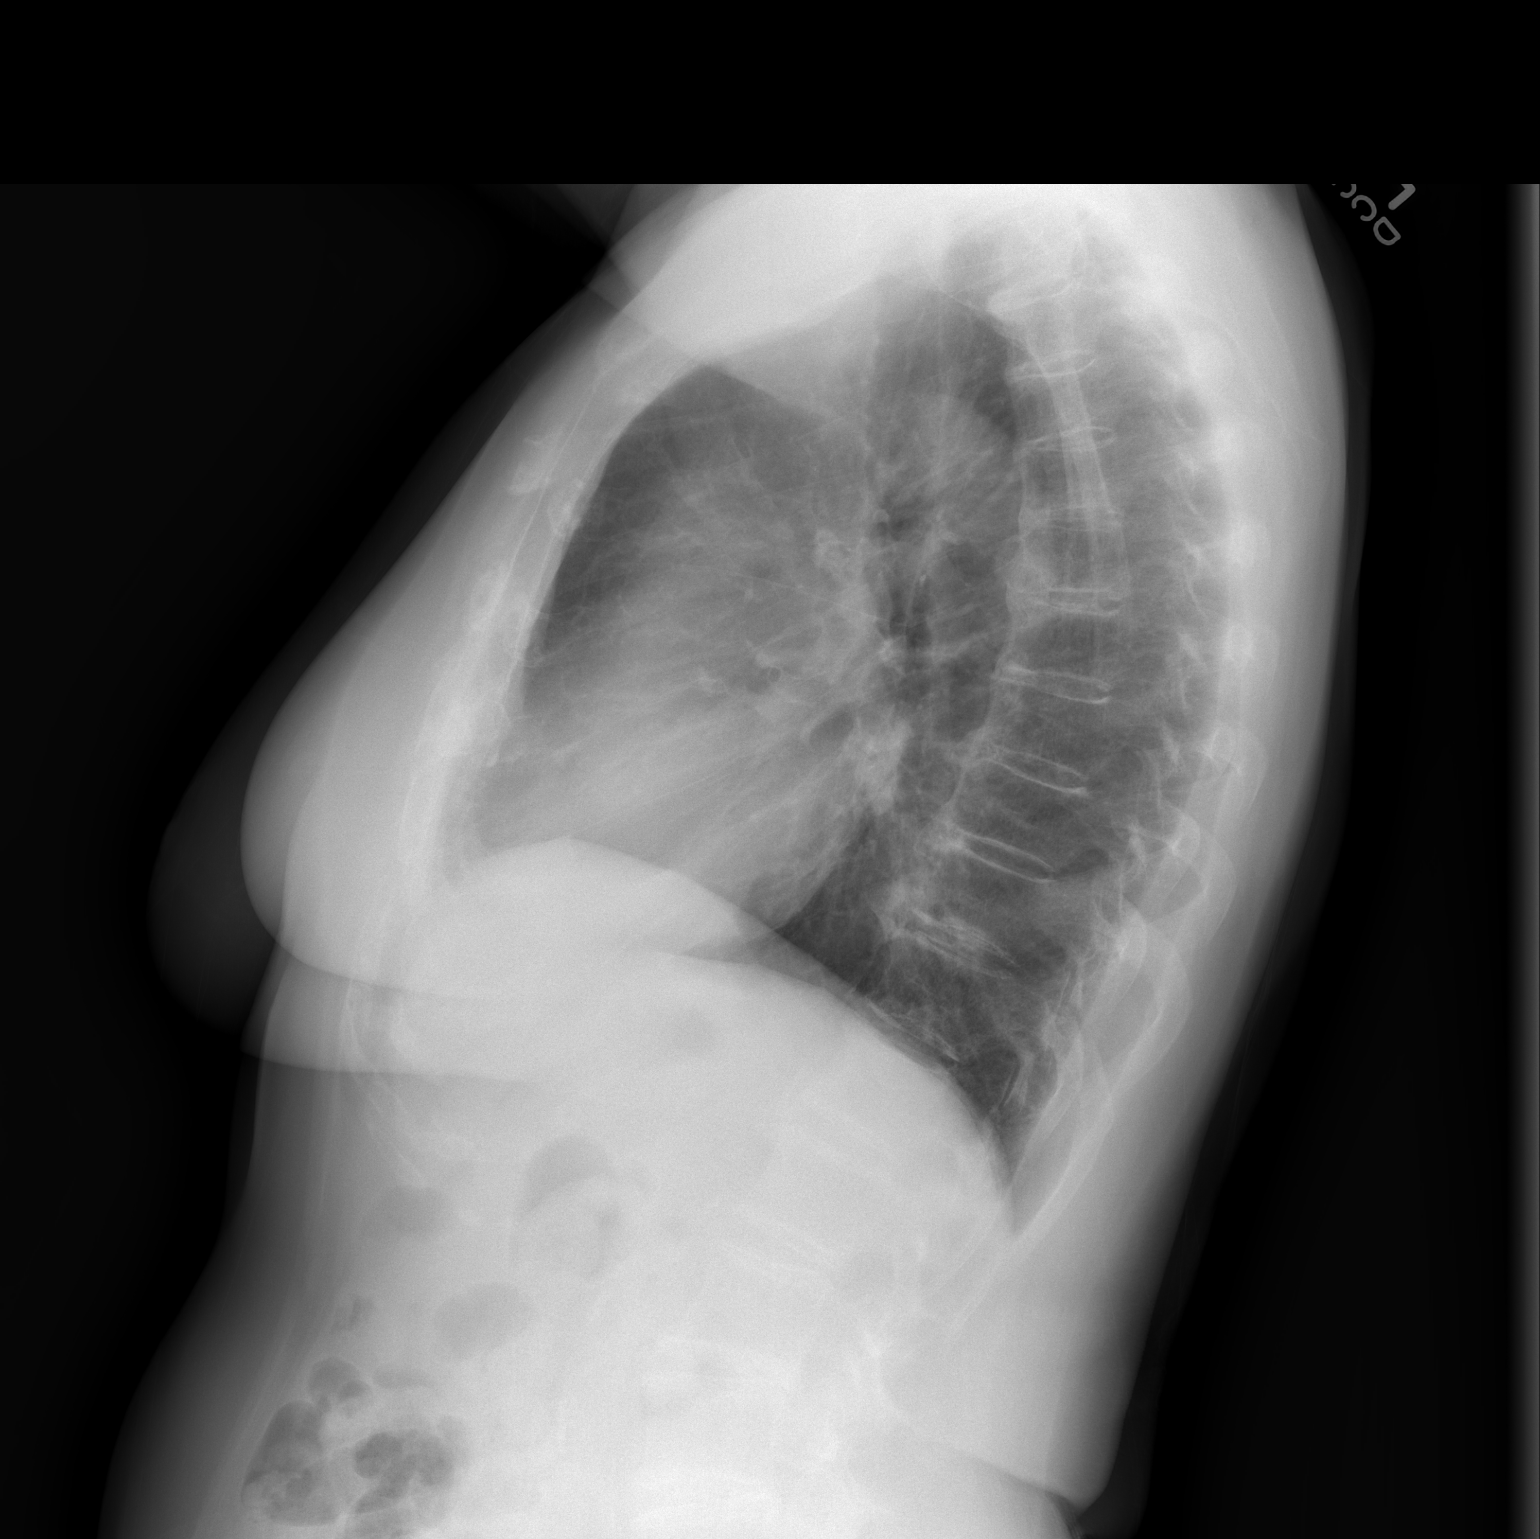

[2 of 2 positions shown; findings below may reference images not displayed]

FINDINGS: Heart size upper normal.  Vascularity is normal.
Negative for mass lesion.  Lungs are clear without infiltrate or
effusion.
IMPRESSION: No active cardiopulmonary disease.

## 2012-12-21 IMAGING — CR DG HIP 1V PORT*L*
1 series · 2 of 2 positions shown · non-contrast
Comparison: 5210 hours the same day and earlier.

CLINICAL DATA: 78-year-old female status post left hip
arthroplasty.

PORTABLE LEFT HIP - 1 VIEW

[Series 1: AP · left · 2 of 2 slices shown]
[im 1/2]
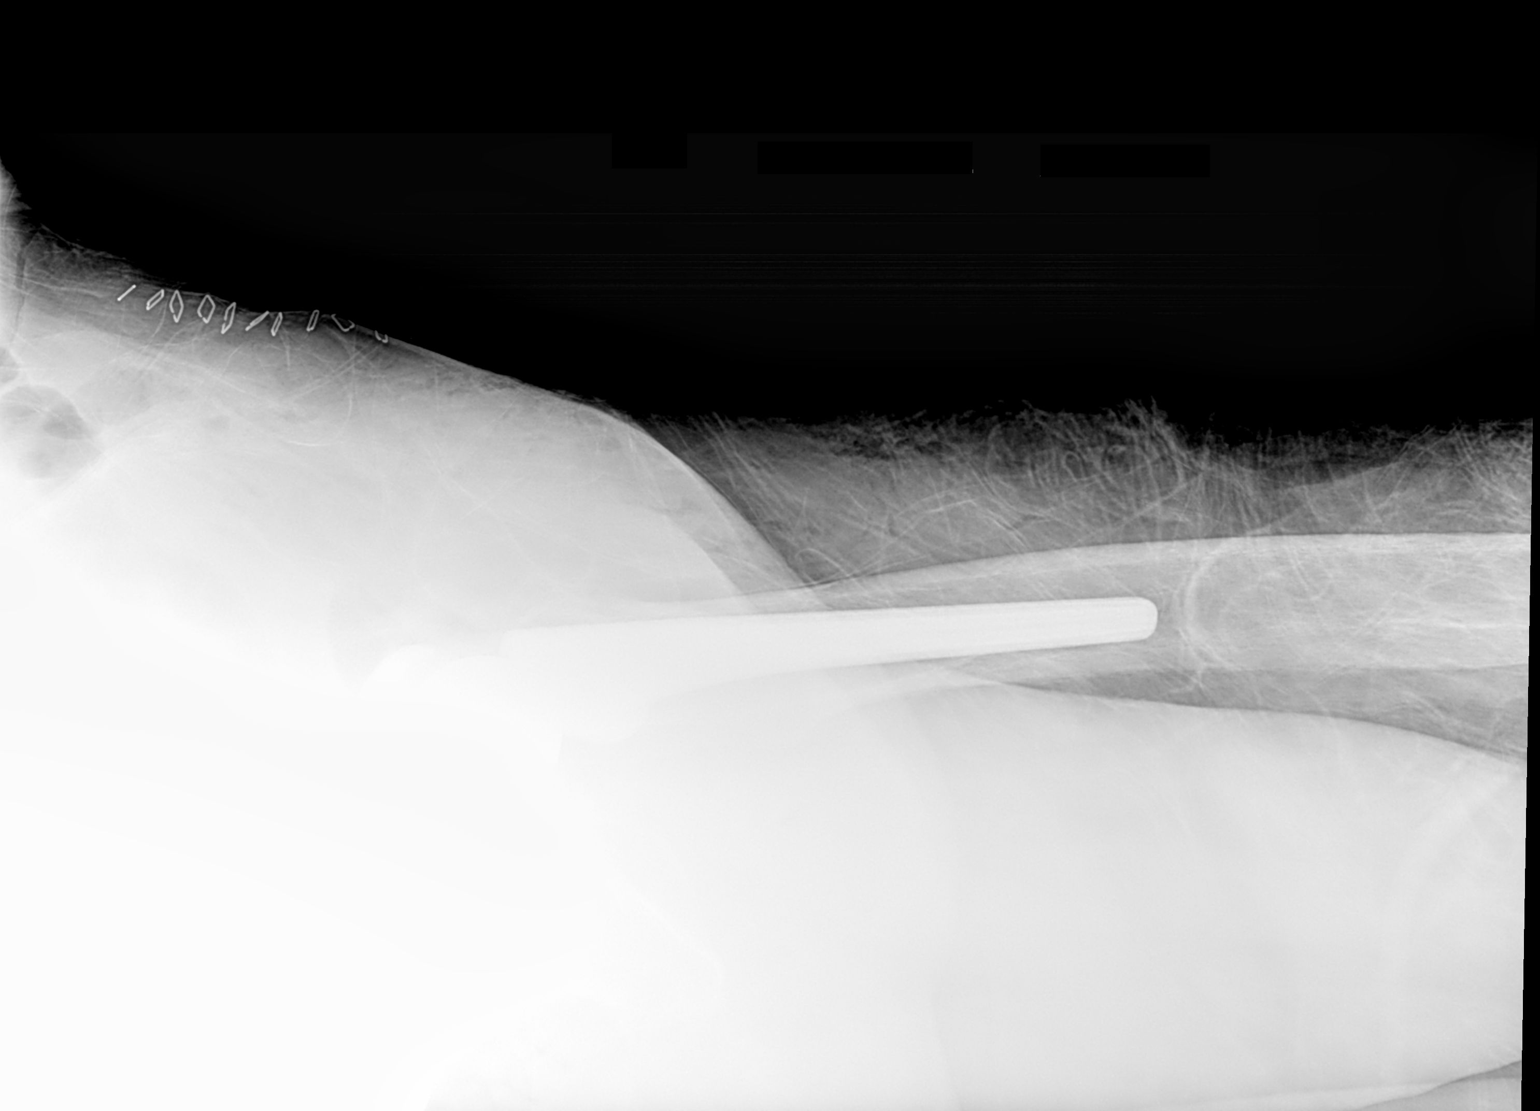
[im 2/2]
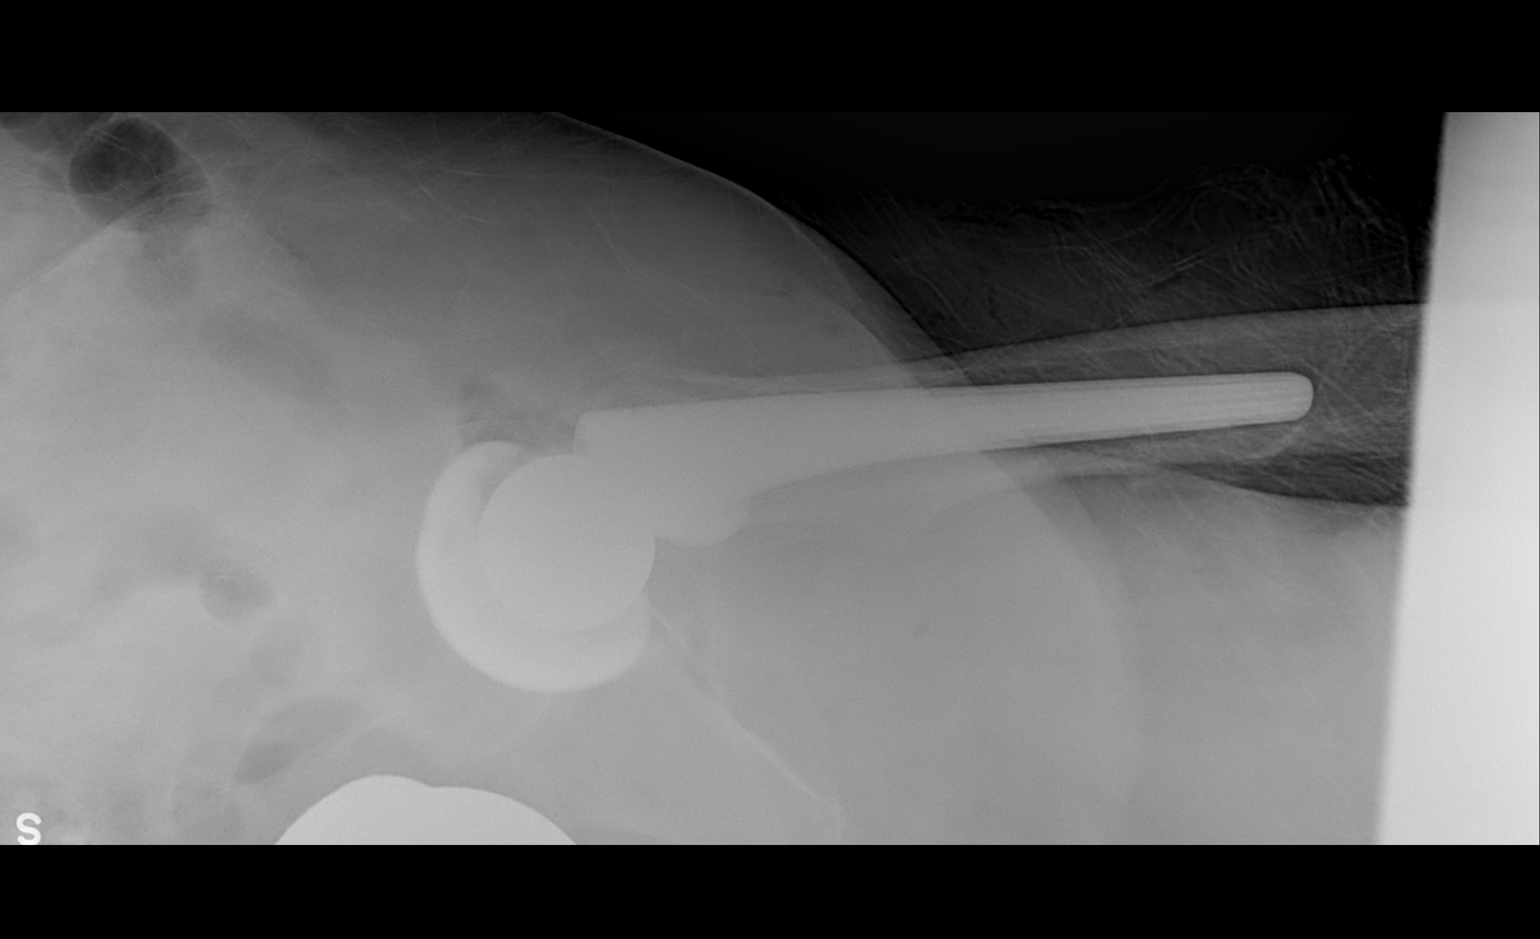

[2 of 2 positions shown; findings below may reference images not displayed]

FINDINGS: Portable cross-table lateral views at 8484 hours.  Left
femoral arthroplasty hardware components are normally aligned.
Partial visualization of the right hip arthroplasty.  Postoperative
changes to the overlying soft tissues again noted.
IMPRESSION: Normal alignment of the left hip arthroplasty.

## 2013-01-02 ENCOUNTER — Other Ambulatory Visit: Payer: Self-pay | Admitting: Gastroenterology

## 2013-07-07 ENCOUNTER — Other Ambulatory Visit: Payer: Self-pay | Admitting: Gastroenterology

## 2013-08-03 ENCOUNTER — Telehealth: Payer: Self-pay | Admitting: *Deleted

## 2013-08-03 NOTE — Telephone Encounter (Signed)
Message copied by Florene GlenBRADLEY, Ravonda Brecheen K on Tue Aug 03, 2013  1:41 PM ------      Message from: Florene GlenBRADLEY, Annalaura Sauseda K      Created: Tue Aug 18, 2012 10:56 AM       Repeat lfts in a year ------

## 2013-08-03 NOTE — Telephone Encounter (Signed)
Mailed pt a letter reminding her to repeat LFTs.

## 2015-04-11 ENCOUNTER — Emergency Department (HOSPITAL_COMMUNITY)
Admission: EM | Admit: 2015-04-11 | Discharge: 2015-04-11 | Disposition: A | Discharge status: Home / Self Care | Payer: Medicare (Managed Care) | Attending: Emergency Medicine | Admitting: Emergency Medicine

## 2015-04-11 ENCOUNTER — Encounter (HOSPITAL_COMMUNITY): Payer: Self-pay

## 2015-04-11 DIAGNOSIS — Z880 Allergy status to penicillin: Secondary | ICD-10-CM | POA: Insufficient documentation

## 2015-04-11 DIAGNOSIS — Z96641 Presence of right artificial hip joint: Secondary | ICD-10-CM | POA: Diagnosis not present

## 2015-04-11 DIAGNOSIS — E785 Hyperlipidemia, unspecified: Secondary | ICD-10-CM | POA: Diagnosis not present

## 2015-04-11 DIAGNOSIS — Z79899 Other long term (current) drug therapy: Secondary | ICD-10-CM | POA: Diagnosis not present

## 2015-04-11 DIAGNOSIS — F419 Anxiety disorder, unspecified: Secondary | ICD-10-CM | POA: Insufficient documentation

## 2015-04-11 DIAGNOSIS — K219 Gastro-esophageal reflux disease without esophagitis: Secondary | ICD-10-CM | POA: Diagnosis not present

## 2015-04-11 DIAGNOSIS — Z7982 Long term (current) use of aspirin: Secondary | ICD-10-CM | POA: Insufficient documentation

## 2015-04-11 DIAGNOSIS — E039 Hypothyroidism, unspecified: Secondary | ICD-10-CM | POA: Insufficient documentation

## 2015-04-11 DIAGNOSIS — R000 Tachycardia, unspecified: Secondary | ICD-10-CM | POA: Insufficient documentation

## 2015-04-11 DIAGNOSIS — M1612 Unilateral primary osteoarthritis, left hip: Secondary | ICD-10-CM | POA: Insufficient documentation

## 2015-04-11 DIAGNOSIS — I10 Essential (primary) hypertension: Secondary | ICD-10-CM | POA: Diagnosis not present

## 2015-04-11 DIAGNOSIS — M810 Age-related osteoporosis without current pathological fracture: Secondary | ICD-10-CM | POA: Insufficient documentation

## 2015-04-11 MED ORDER — AMLODIPINE BESYLATE 5 MG PO TABS: 10.0000 mg | ORAL_TABLET | Freq: Every day | ORAL | Status: DC

## 2015-04-11 MED ORDER — CARVEDILOL 12.5 MG PO TABS: 12.5000 mg | ORAL_TABLET | Freq: Two times a day (BID) | ORAL | Status: AC

## 2015-04-11 MED ORDER — CARVEDILOL 12.5 MG PO TABS: 25.0000 mg | ORAL_TABLET | Freq: Two times a day (BID) | ORAL | Status: DC

## 2015-04-11 NOTE — ED Notes (Signed)
Bed: WA07 Expected date:  Expected time:  Means of arrival:  Comments: EMS- 82yo F, HTN

## 2015-04-11 NOTE — ED Notes (Signed)
Pt states BP has been elevated since last evening.  Pt compliant with meds.  Pt states she had mild headache last evening but denies today.

## 2015-04-11 NOTE — Discharge Instructions (Signed)

## 2015-04-11 NOTE — ED Notes (Signed)
Pt transported by EMS for hypertension.  Pt took her BP at home this AM on automated machine and states it was 200's/100's.  EMS reports patient was very anxious and pacing upon their arrival.  Pt denies headache, chest pain, other complaints.  Pt is compliant with medications.

## 2015-04-11 NOTE — ED Provider Notes (Signed)
CSN: [id number]     Arrival date & time 04/11/15  1328 History   First MD Initiated Contact with Patient 04/11/15 1501     Chief Complaint  Patient presents with  . Hypertension     (Consider location/radiation/quality/duration/timing/severity/associated sxs/prior Treatment) Patient is a 81 y.o. female presenting with hypertension. The history is provided by the patient.  Hypertension This is a chronic problem. The current episode started yesterday. The problem occurs constantly. The problem has not changed since onset.Pertinent negatives include no chest pain, no abdominal pain, no headaches and no shortness of breath. Nothing aggravates the symptoms. Nothing relieves the symptoms. She has tried nothing for the symptoms.    Past Medical History  Diagnosis Date  . Anxiety   . Hyperlipidemia   . Hypertension   . Hypothyroidism   . Osteoporosis   . GERD (gastroesophageal reflux disease)   . Gastritis   . Diverticulosis   . Hemorrhoids   . Arthritis     SEVERE OA LEFT HIP  - S/P RIGHT HIP REPLACEMENT / OCCASIONAL BACK AND NECK PAIN  . Complication of anesthesia     difficulty waking up   Past Surgical History  Procedure Laterality Date  . Femoral hernia repair    . Vaginal hysterectomy    . Bladder surgery      tacting and sling  . Tonsillectomy    . Bone spurs      BOTH HEELS - NO SURGERY -JUST INJECTIONS  . Total hip arthroplasty  11/29/2011    Procedure: TOTAL HIP ARTHROPLASTY ANTERIOR APPROACH;  Surgeon: Kathryne Hitch, MD;  Location: [facility] ORS;  Service: Orthopedics;  Laterality: Left;  . Tonsillectomy    . Joint replacement      hip replacements times 2  . Abdominal hysterectomy    . Total hip revision  04/07/2012    Procedure: TOTAL HIP REVISION;  Surgeon: Kathryne Hitch, MD;  Location: [facility] OR;  Service: Orthopedics;  Laterality: Right;  Revision right hip acetabular component   Family History  Problem Relation Age of Onset  . Uterine cancer Sister    . Breast cancer Sister   . Lung cancer Brother   . Cirrhosis Brother     drinker  . Alzheimer's disease Brother   . Colon cancer Neg Hx    Social History  Substance Use Topics  . Smoking status: Never Smoker   . Smokeless tobacco: Never Used  . Alcohol Use: No   OB History    No data available     Review of Systems  Respiratory: Negative for shortness of breath.   Cardiovascular: Negative for chest pain.  Gastrointestinal: Negative for abdominal pain.  Neurological: Negative for headaches.  All other systems reviewed and are negative.     Allergies  Penicillins cross reactors and Sulfa drugs cross reactors  Home Medications   Prior to Admission medications   Medication Sig Start Date End Date Taking? Authorizing Carleta Woodrow  ALPRAZolam Prudy Feeler) 0.5 MG tablet Take 0.5 mg by mouth 2 (two) times daily as needed for anxiety or sleep.    Yes Historical Gabriel Conry, MD  amLODipine (NORVASC) 5 MG tablet Take 5 mg by mouth daily with breakfast.    Yes Historical Leanore Biggers, MD  Calcium Carbonate-Vitamin D 600-400 MG-UNIT per tablet Take 1 tablet by mouth daily.    Yes Historical Keagan Brislin, MD  carvedilol (COREG) 12.5 MG tablet Take 12.5 mg by mouth 2 (two) times daily with a meal.   Yes Historical Rashawnda Gaba, MD  cholecalciferol (VITAMIN D) 1000 UNITS tablet Take 2,000 Units by mouth daily.   Yes Historical Mayli Covington, MD  denosumab (PROLIA) 60 MG/ML SOLN Inject 60 mg into the skin every 6 (six) months.    Yes Historical Kurt Azimi, MD  estradiol (ESTRACE) 0.1 MG/GM vaginal cream Place 2 g vaginally See admin instructions. One to two times weekly   Yes Historical Freddie Dymek, MD  fluticasone (FLONASE) 50 MCG/ACT nasal spray Place 2 sprays into both nostrils daily as needed for allergies or rhinitis.   Yes Historical Arla Boutwell, MD  levothyroxine (SYNTHROID, LEVOTHROID) 50 MCG tablet Take 50 mcg by mouth daily with breakfast.    Yes Historical Shloime Keilman, MD  loratadine (CLARITIN) 10 MG tablet Take 10  mg by mouth daily as needed for allergies.   Yes Historical Lorrain Rivers, MD  memantine (NAMENDA) 10 MG tablet Take 10 mg by mouth daily.   Yes Historical Terrelle Ruffolo, MD  Multiple Vitamin (MULTIVITAMIN WITH MINERALS) TABS Take 1 tablet by mouth daily.   Yes Historical Margi Edmundson, MD  Omega-3 Fatty Acids (FISH OIL) 1000 MG CAPS Take 1,000 mg by mouth daily.    Yes Historical Elverna Caffee, MD  sertraline (ZOLOFT) 50 MG tablet Take 50 mg by mouth daily.   Yes Historical Taiquan Campanaro, MD  simvastatin (ZOCOR) 20 MG tablet Take 20 mg by mouth daily.   Yes Historical Jerusalem Brownstein, MD  timolol (BETIMOL) 0.5 % ophthalmic solution Place 1 drop into both eyes 2 (two) times daily.   Yes Historical Loris Seelye, MD  travoprost, benzalkonium, (TRAVATAN) 0.004 % ophthalmic solution Place 1 drop into both eyes at bedtime.   Yes Historical Preston Weill, MD  valsartan-hydrochlorothiazide (DIOVAN-HCT) 320-25 MG tablet Take 1 tablet by mouth daily.   Yes Historical Mylea Roarty, MD  aspirin EC 325 MG EC tablet Take 1 tablet (325 mg total) by mouth daily. Patient not taking: Reported on 04/11/2015 04/09/12   Kathryne Hitch, MD  omeprazole (PRILOSEC) 40 MG capsule TAKE 1 CAPSULE DAILY. Patient not taking: Reported on 04/11/2015 07/07/13   Mardella Layman, MD   BP 192/88 mmHg  Pulse 58  Temp(Src) 97.8 F (36.6 C) (Oral)  Resp 14  Ht  (1.626 m)  Wt 142 lb (64.411 kg)  BMI 24.36 kg/m2  SpO2 96% Physical Exam  Constitutional: She is oriented to person, place, and time. She appears well-developed and well-nourished. No distress.  HENT:  Head: Normocephalic.  Eyes: Conjunctivae are normal.  Neck: Neck supple. No tracheal deviation present.  Cardiovascular: Normal rate, regular rhythm and normal heart sounds.   Pulmonary/Chest: Effort normal and breath sounds normal. No respiratory distress.  Abdominal: Soft. She exhibits no distension.  Neurological: She is alert and oriented to person, place, and time. She has normal strength. No  cranial nerve deficit or sensory deficit. Coordination and gait normal.  Skin: Skin is warm and dry.  Psychiatric: She has a normal mood and affect.    ED Course  Procedures (including critical care time) Labs Review Labs Reviewed - No data to display  Imaging Review No results found. I have personally reviewed and evaluated these images and lab results as part of my medical decision-making.   EKG Interpretation None      MDM   Final diagnoses:  Chronic hypertension    Resents with asymptomatic hypertension, she noted her blood pressure had been rising since yesterday when she felt unwell with no specific symptoms. She is on maximum dose of Diovan HCT and is on a moderate dose of carvedilol and is already mildly  bradycardic. Norvasc has not been maximized so we will increase her dose from 5 to 10 mg today and have her follow-up with her primary care physician for further workup and management of chronic hypertension.    Lyndal Pulley, MD 04/11/15 1537

## 2016-12-16 ENCOUNTER — Encounter (HOSPITAL_COMMUNITY): Payer: Self-pay | Admitting: Emergency Medicine

## 2016-12-16 ENCOUNTER — Emergency Department (HOSPITAL_COMMUNITY): Payer: Medicare Other

## 2016-12-16 ENCOUNTER — Inpatient Hospital Stay (HOSPITAL_COMMUNITY)
Admission: EM | Admit: 2016-12-16 | Discharge: 2016-12-18 | DRG: 071 | Disposition: A | Discharge status: Home Health | Payer: Medicare Other | Attending: Family Medicine | Admitting: Family Medicine

## 2016-12-16 ENCOUNTER — Inpatient Hospital Stay (HOSPITAL_COMMUNITY): Payer: Medicare Other

## 2016-12-16 DIAGNOSIS — Z90710 Acquired absence of both cervix and uterus: Secondary | ICD-10-CM

## 2016-12-16 DIAGNOSIS — K219 Gastro-esophageal reflux disease without esophagitis: Secondary | ICD-10-CM | POA: Diagnosis present

## 2016-12-16 DIAGNOSIS — R410 Disorientation, unspecified: Secondary | ICD-10-CM | POA: Diagnosis not present

## 2016-12-16 DIAGNOSIS — Z96641 Presence of right artificial hip joint: Secondary | ICD-10-CM | POA: Diagnosis present

## 2016-12-16 DIAGNOSIS — I639 Cerebral infarction, unspecified: Secondary | ICD-10-CM | POA: Diagnosis present

## 2016-12-16 DIAGNOSIS — F0390 Unspecified dementia without behavioral disturbance: Secondary | ICD-10-CM | POA: Diagnosis present

## 2016-12-16 DIAGNOSIS — N390 Urinary tract infection, site not specified: Secondary | ICD-10-CM | POA: Diagnosis present

## 2016-12-16 DIAGNOSIS — B957 Other staphylococcus as the cause of diseases classified elsewhere: Secondary | ICD-10-CM | POA: Diagnosis present

## 2016-12-16 DIAGNOSIS — I10 Essential (primary) hypertension: Secondary | ICD-10-CM | POA: Diagnosis present

## 2016-12-16 DIAGNOSIS — I6381 Other cerebral infarction due to occlusion or stenosis of small artery: Secondary | ICD-10-CM

## 2016-12-16 DIAGNOSIS — G9340 Encephalopathy, unspecified: Secondary | ICD-10-CM | POA: Diagnosis present

## 2016-12-16 DIAGNOSIS — M810 Age-related osteoporosis without current pathological fracture: Secondary | ICD-10-CM | POA: Diagnosis present

## 2016-12-16 DIAGNOSIS — E785 Hyperlipidemia, unspecified: Secondary | ICD-10-CM | POA: Diagnosis present

## 2016-12-16 DIAGNOSIS — E039 Hypothyroidism, unspecified: Secondary | ICD-10-CM | POA: Diagnosis present

## 2016-12-16 DIAGNOSIS — G9341 Metabolic encephalopathy: Principal | ICD-10-CM | POA: Diagnosis present

## 2016-12-16 DIAGNOSIS — Z882 Allergy status to sulfonamides status: Secondary | ICD-10-CM

## 2016-12-16 DIAGNOSIS — R001 Bradycardia, unspecified: Secondary | ICD-10-CM | POA: Diagnosis present

## 2016-12-16 DIAGNOSIS — R29702 NIHSS score 2: Secondary | ICD-10-CM | POA: Diagnosis not present

## 2016-12-16 DIAGNOSIS — I739 Peripheral vascular disease, unspecified: Secondary | ICD-10-CM | POA: Diagnosis present

## 2016-12-16 DIAGNOSIS — F419 Anxiety disorder, unspecified: Secondary | ICD-10-CM | POA: Diagnosis present

## 2016-12-16 DIAGNOSIS — Z820 Family history of epilepsy and other diseases of the nervous system: Secondary | ICD-10-CM

## 2016-12-16 DIAGNOSIS — E876 Hypokalemia: Secondary | ICD-10-CM | POA: Diagnosis present

## 2016-12-16 DIAGNOSIS — I160 Hypertensive urgency: Secondary | ICD-10-CM | POA: Diagnosis present

## 2016-12-16 DIAGNOSIS — Z79899 Other long term (current) drug therapy: Secondary | ICD-10-CM

## 2016-12-16 DIAGNOSIS — Z880 Allergy status to penicillin: Secondary | ICD-10-CM

## 2016-12-16 LAB — DIFFERENTIAL
BASOS PCT: 0 %
Basophils Absolute: 0 10*3/uL (ref 0.0–0.1)
EOS ABS: 0 10*3/uL (ref 0.0–0.7)
EOS PCT: 1 %
LYMPHS ABS: 2.3 10*3/uL (ref 0.7–4.0)
Lymphocytes Relative: 28 %
MONOS PCT: 7 %
Monocytes Absolute: 0.6 10*3/uL (ref 0.1–1.0)
Neutro Abs: 5.2 10*3/uL (ref 1.7–7.7)
Neutrophils Relative %: 64 %

## 2016-12-16 LAB — COMPREHENSIVE METABOLIC PANEL
ALT: 18 U/L (ref 14–54)
ANION GAP: 11 (ref 5–15)
AST: 48 U/L — ABNORMAL HIGH (ref 15–41)
Albumin: 4.5 g/dL (ref 3.5–5.0)
Alkaline Phosphatase: 39 U/L (ref 38–126)
BUN: 20 mg/dL (ref 6–20)
CO2: 26 mmol/L (ref 22–32)
Calcium: 9.2 mg/dL (ref 8.9–10.3)
Chloride: 104 mmol/L (ref 101–111)
Creatinine, Ser: 0.65 mg/dL (ref 0.44–1.00)
Glucose, Bld: 100 mg/dL — ABNORMAL HIGH (ref 65–99)
POTASSIUM: 3 mmol/L — AB (ref 3.5–5.1)
Sodium: 141 mmol/L (ref 135–145)
TOTAL PROTEIN: 7.1 g/dL (ref 6.5–8.1)
Total Bilirubin: 0.5 mg/dL (ref 0.3–1.2)

## 2016-12-16 LAB — CBC
HEMATOCRIT: 41.5 % (ref 36.0–46.0)
HEMOGLOBIN: 14.7 g/dL (ref 12.0–15.0)
MCH: 32 pg (ref 26.0–34.0)
MCHC: 35.4 g/dL (ref 30.0–36.0)
MCV: 90.4 fL (ref 78.0–100.0)
Platelets: 192 10*3/uL (ref 150–400)
RBC: 4.59 MIL/uL (ref 3.87–5.11)
RDW: 13.3 % (ref 11.5–15.5)
WBC: 8.1 10*3/uL (ref 4.0–10.5)

## 2016-12-16 LAB — I-STAT CHEM 8, ED
BUN: 21 mg/dL — AB (ref 6–20)
CALCIUM ION: 1.07 mmol/L — AB (ref 1.15–1.40)
Chloride: 104 mmol/L (ref 101–111)
Creatinine, Ser: 0.7 mg/dL (ref 0.44–1.00)
Glucose, Bld: 97 mg/dL (ref 65–99)
HCT: 41 % (ref 36.0–46.0)
HEMOGLOBIN: 13.9 g/dL (ref 12.0–15.0)
Potassium: 2.9 mmol/L — ABNORMAL LOW (ref 3.5–5.1)
SODIUM: 141 mmol/L (ref 135–145)
TCO2: 26 mmol/L (ref 0–100)

## 2016-12-16 LAB — I-STAT TROPONIN, ED: TROPONIN I, POC: 0.01 ng/mL (ref 0.00–0.08)

## 2016-12-16 LAB — CBG MONITORING, ED: GLUCOSE-CAPILLARY: 97 mg/dL (ref 65–99)

## 2016-12-16 LAB — APTT: aPTT: 35 seconds (ref 24–36)

## 2016-12-16 LAB — PROTIME-INR
INR: 1.04
Prothrombin Time: 13.6 seconds (ref 11.4–15.2)

## 2016-12-16 MED ORDER — SODIUM CHLORIDE 0.9 % IV SOLN
INTRAVENOUS | Status: DC
  Administered 2016-12-16: 22:00:00 via INTRAVENOUS

## 2016-12-16 MED ORDER — MEMANTINE HCL 10 MG PO TABS
10.0000 mg | ORAL_TABLET | Freq: Two times a day (BID) | ORAL | Status: DC
  Administered 2016-12-17 – 2016-12-18 (×3): 10 mg via ORAL
  Filled 2016-12-16 (×4): qty 1

## 2016-12-16 MED ORDER — ENOXAPARIN SODIUM 40 MG/0.4ML ~~LOC~~ SOLN
40.0000 mg | Freq: Every day | SUBCUTANEOUS | Status: DC
  Administered 2016-12-17 (×2): 40 mg via SUBCUTANEOUS
  Filled 2016-12-16 (×2): qty 0.4

## 2016-12-16 MED ORDER — POTASSIUM PHOSPHATES 15 MMOLE/5ML IV SOLN
20.0000 meq | Freq: Once | INTRAVENOUS | Status: AC
  Administered 2016-12-16: 20 meq via INTRAVENOUS
  Filled 2016-12-16: qty 4.55

## 2016-12-16 MED ORDER — HYDRALAZINE HCL 20 MG/ML IJ SOLN: 5.0000 mg | INTRAMUSCULAR | Status: DC | PRN

## 2016-12-16 MED ORDER — ASPIRIN 300 MG RE SUPP: 300.0000 mg | Freq: Every day | RECTAL | Status: DC

## 2016-12-16 MED ORDER — STROKE: EARLY STAGES OF RECOVERY BOOK
Freq: Once | Status: DC
  Filled 2016-12-16: qty 1

## 2016-12-16 MED ORDER — LABETALOL HCL 5 MG/ML IV SOLN
5.0000 mg | INTRAVENOUS | Status: DC | PRN
  Administered 2016-12-17: 10 mg via INTRAVENOUS
  Administered 2016-12-17: 5 mg via INTRAVENOUS
  Filled 2016-12-16 (×2): qty 4

## 2016-12-16 MED ORDER — NICARDIPINE HCL IN NACL 20-0.86 MG/200ML-% IV SOLN
3.0000 mg/h | Freq: Once | INTRAVENOUS | Status: AC
  Administered 2016-12-16: 5 mg/h via INTRAVENOUS
  Filled 2016-12-16: qty 200

## 2016-12-16 MED ORDER — ACETAMINOPHEN 325 MG PO TABS: 650.0000 mg | ORAL_TABLET | ORAL | Status: DC | PRN

## 2016-12-16 MED ORDER — MAGNESIUM SULFATE IN D5W 1-5 GM/100ML-% IV SOLN
1.0000 g | Freq: Once | INTRAVENOUS | Status: AC
Start: 2016-12-16 — End: 2016-12-16
  Administered 2016-12-16: 1 g via INTRAVENOUS
  Filled 2016-12-16: qty 100

## 2016-12-16 MED ORDER — SENNOSIDES-DOCUSATE SODIUM 8.6-50 MG PO TABS: 1.0000 | ORAL_TABLET | Freq: Every evening | ORAL | Status: DC | PRN

## 2016-12-16 MED ORDER — ACETAMINOPHEN 650 MG RE SUPP: 650.0000 mg | RECTAL | Status: DC | PRN

## 2016-12-16 MED ORDER — ACETAMINOPHEN 160 MG/5ML PO SOLN: 650.0000 mg | ORAL | Status: DC | PRN

## 2016-12-16 MED ORDER — LEVOTHYROXINE SODIUM 50 MCG PO TABS
50.0000 ug | ORAL_TABLET | Freq: Every day | ORAL | Status: DC
Start: 2016-12-17 — End: 2016-12-18
  Administered 2016-12-17 – 2016-12-18 (×2): 50 ug via ORAL
  Filled 2016-12-16 (×2): qty 1

## 2016-12-16 MED ORDER — ASPIRIN 325 MG PO TABS
325.0000 mg | ORAL_TABLET | Freq: Every day | ORAL | Status: DC
  Administered 2016-12-16 – 2016-12-18 (×3): 325 mg via ORAL
  Filled 2016-12-16 (×3): qty 1

## 2016-12-16 MED ORDER — VITAMIN D 1000 UNITS PO TABS
2000.0000 [IU] | ORAL_TABLET | Freq: Every day | ORAL | Status: DC
  Administered 2016-12-17 – 2016-12-18 (×2): 2000 [IU] via ORAL
  Filled 2016-12-16 (×2): qty 2

## 2016-12-16 NOTE — ED Triage Notes (Signed)
Per pt/friend-states her blood pressure has been high for a couple of days-friend states she has been talking out of her head since this am-states BP high today-PCP told her to come to ED for workup

## 2016-12-16 NOTE — ED Notes (Signed)
Patient transported to MRI 

## 2016-12-16 NOTE — H&P (Signed)
History and Physical    Julie [name] WUJ:[id number] DOB: 12/31/1932 DOA: 12/16/2016  PCP: Willow OraAndy, Camille L, MD   Patient coming from: Home  Chief Complaint: Confusion  HPI: Julie [name] is a 83 y.o. female with medical history significant for hypothyroidism, dementia, and hypertension, now presenting to the emergency department for several days of confusion. Patient is accompanied by her significant other who assists with the history. Patient had reportedly been in her usual state of health until approximately 4 days ago when she began to experience a nonspecific malaise and confusion. Patient describes having increased difficulty with memory over the past few days. Her significant other has appreciated the confusion and reports that she was saying some bizarre things this morning and did not remember it later. They decided to check her blood pressure and found it to be greater than 200/100. They called the PCP for advice and were directed to the ED for further evaluation. Patient denies any recent fall or trauma and denies any headache, change in vision or hearing, or focal numbness or weakness. She denies recent fevers or chills and denies cough or dyspnea, chest pain, or palpitations. She has never experienced similar symptoms previously.   ED Course: Upon arrival to the ED, patient is found to be afebrile, saturating well on room air, hypertensive to 206/130, and with vitals otherwise stable. EKG features a sinus bradycardia with rate 57 and chest x-ray is negative for acute cardiopulmonary disease. Chemistry panel reveals a potassium of 2.9 and elevated BUN to creatinine ratio. CBC is unremarkable. Noncontrast head CT is notable for small lacunar infarcts along the anterior inferior capsules bilaterally, and the left basal ganglia. Neurology was consulted by the ED physician and advised for a [facility], and advises lowering her blood pressure with a goal systolic of 180.  Patient was placed on nicardipine infusion, remained stable in the ED, and will be admitted [facility] stepdown unit [facility] for ongoing evaluation and management of confusion and hypertensive urgency, suspected secondary to a new lacunar infarcts.  Review of Systems:  All other systems reviewed and apart from HPI, are negative.  Past Medical History:  Diagnosis Date  . Anxiety   . Arthritis    SEVERE OA LEFT HIP  - S/P RIGHT HIP REPLACEMENT / OCCASIONAL BACK AND NECK PAIN  . Complication of anesthesia    difficulty waking up  . Diverticulosis   . Gastritis   . GERD (gastroesophageal reflux disease)   . Hemorrhoids   . Hyperlipidemia   . Hypertension   . Hypothyroidism   . Osteoporosis     Past Surgical History:  Procedure Laterality Date  . ABDOMINAL HYSTERECTOMY    . BLADDER SURGERY     tacting and sling  . BONE SPURS     BOTH HEELS - NO SURGERY -JUST INJECTIONS  . FEMORAL HERNIA REPAIR    . JOINT REPLACEMENT     hip replacements times 2  . TONSILLECTOMY    . TONSILLECTOMY    . TOTAL HIP ARTHROPLASTY  11/29/2011   Procedure: TOTAL HIP ARTHROPLASTY ANTERIOR APPROACH;  Surgeon: Kathryne Hitchhristopher Y Blackman, MD;  Location: [facility];  Service: Orthopedics;  Laterality: Left;  . TOTAL HIP REVISION  04/07/2012   Procedure: TOTAL HIP REVISION;  Surgeon: Kathryne Hitchhristopher Y Blackman, MD;  [facility];  Service: Orthopedics;  Laterality: Right;  Revision right hip acetabular component  . VAGINAL HYSTERECTOMY       reports that she has never  smoked. She has never used smokeless tobacco. She reports that she does not drink alcohol or use drugs.  Allergies  Allergen Reactions  . Penicillins Cross Reactors Rash  . Sulfa Drugs Cross Reactors Rash    Family History  Problem Relation Age of Onset  . Uterine cancer Sister   . Breast cancer Sister   . Lung cancer Brother   . Cirrhosis Brother        drinker  . Alzheimer's disease Brother   . Colon cancer Neg Hx      Prior to  Admission medications   Medication Sig Start Date End Date Taking? Authorizing Provider  Calcium Carbonate-Vitamin D 600-400 MG-UNIT per tablet Take 1 tablet by mouth daily.    Yes [provider]  carvedilol (COREG) 12.5 MG tablet Take 1 tablet (12.5 mg total) by mouth 2 (two) times daily with a meal. 04/11/15  Yes Lyndal Pulley, MD  cholecalciferol (VITAMIN D) 1000 UNITS tablet Take 2,000 Units by mouth daily.   Yes [provider]  denosumab (PROLIA) 60 MG/ML SOLN injection Inject 60 mg into the skin every 6 (six) months.    Yes [provider]  levothyroxine (SYNTHROID, LEVOTHROID) 50 MCG tablet Take 50 mcg by mouth daily with breakfast.    Yes [provider]  memantine (NAMENDA) 10 MG tablet Take 10 mg by mouth 2 (two) times daily.    Yes [provider]  valsartan-hydrochlorothiazide (DIOVAN-HCT) 320-25 MG tablet Take 1 tablet by mouth daily.   Yes [provider]  amLODipine (NORVASC) 5 MG tablet Take 2 tablets (10 mg total) by mouth daily with breakfast. Patient not taking: Reported on 12/16/2016 04/11/15   Lyndal Pulley, MD  aspirin EC 325 MG EC tablet Take 1 tablet (325 mg total) by mouth daily. Patient not taking: Reported on 04/11/2015 04/09/12   Kathryne Hitch, MD  omeprazole (PRILOSEC) 40 MG capsule TAKE 1 CAPSULE DAILY. Patient not taking: Reported on 04/11/2015 07/07/13   Mardella Layman, MD    Physical Exam: Vitals:   12/16/16 1825 12/16/16 1830 12/16/16 1845 12/16/16 1850  BP: (!) 176/92 (!) 155/65 (!) 166/105 (!) 193/103  Pulse: 63 60 65 63  Resp: 15 15 (!) 22 19  Temp:      TempSrc:      SpO2: 95% 96% 96% 96%      Constitutional: NAD, calm, comfortable Eyes: PERTLA, lids and conjunctivae normal ENMT: Mucous membranes are moist. Posterior pharynx clear of any exudate or lesions.   Neck: normal, supple, no masses, no thyromegaly Respiratory: clear to auscultation bilaterally, no wheezing, no crackles.  Normal respiratory effort.    Cardiovascular: S1 & S2 heard, regular rate and rhythm. No significant JVD. Abdomen: No distension, no tenderness, no masses palpated. Bowel sounds normal.  Musculoskeletal: no clubbing / cyanosis. No joint deformity upper and lower extremities.   Skin: no significant rashes, lesions, ulcers. Warm, dry, well-perfused. Neurologic: CN 2-12 grossly intact. Sensation intact, DTR normal. Strength 5/5 in all 4 limbs.  Psychiatric: Alert and oriented to person and place, but not oriented to month or year. Pleasant and cooperative.     Labs on Admission: I have personally reviewed following labs and imaging studies  CBC:  Recent Labs Lab 12/16/16 1719 12/16/16 1747  WBC 8.1  --   NEUTROABS 5.2  --   HGB 14.7 13.9  HCT 41.5 41.0  MCV 90.4  --   PLT 192  --    Basic Metabolic Panel:  Recent  Labs Lab 12/16/16 1719 12/16/16 1747  NA 141 141  K 3.0* 2.9*  CL 104 104  CO2 26  --   GLUCOSE 100* 97  BUN 20 21*  CREATININE 0.65 0.70  CALCIUM 9.2  --    GFR: CrCl cannot be calculated (Unknown ideal weight.). Liver Function Tests:  Recent Labs Lab 12/16/16 1719  AST 48*  ALT 18  ALKPHOS 39  BILITOT 0.5  PROT 7.1  ALBUMIN 4.5   No results for input(s): LIPASE, AMYLASE in the last 168 hours. No results for input(s): AMMONIA in the last 168 hours. Coagulation Profile:  Recent Labs Lab 12/16/16 1719  INR 1.04   Cardiac Enzymes: No results for input(s): CKTOTAL, CKMB, CKMBINDEX, TROPONINI in the last 168 hours. BNP (last 3 results) No results for input(s): PROBNP in the last 8760 hours. HbA1C: No results for input(s): HGBA1C in the last 72 hours. CBG:  Recent Labs Lab 12/16/16 1736  GLUCAP 97   Lipid Profile: No results for input(s): CHOL, HDL, LDLCALC, TRIG, CHOLHDL, LDLDIRECT in the last 72 hours. Thyroid Function Tests: No results for input(s): TSH, T4TOTAL, FREET4, T3FREE, THYROIDAB in the last 72 hours. Anemia Panel: No  results for input(s): VITAMINB12, FOLATE, FERRITIN, TIBC, IRON, RETICCTPCT in the last 72 hours. Urine analysis:    Component Value Date/Time   COLORURINE YELLOW 03/31/2012 1048   APPEARANCEUR CLOUDY (A) 03/31/2012 1048   LABSPEC 1.013 03/31/2012 1048   PHURINE 6.5 03/31/2012 1048   GLUCOSEU NEGATIVE 03/31/2012 1048   HGBUR TRACE (A) 03/31/2012 1048   BILIRUBINUR NEGATIVE 03/31/2012 1048   KETONESUR NEGATIVE 03/31/2012 1048   PROTEINUR NEGATIVE 03/31/2012 1048   UROBILINOGEN 0.2 03/31/2012 1048   NITRITE NEGATIVE 03/31/2012 1048   LEUKOCYTESUR NEGATIVE 03/31/2012 1048   Sepsis Labs: @LABRCNTIP (procalcitonin:4,lacticidven:4) )No results found for this or any previous visit (from the past 240 hour(s)).   Radiological Exams on Admission: Ct Head Wo Contrast  Result Date: 12/16/2016 CLINICAL DATA:  Hypertension, altered mental status EXAM: CT HEAD WITHOUT CONTRAST TECHNIQUE: Contiguous axial images were obtained from the base of the skull through the vertex without intravenous contrast. COMPARISON:  None. FINDINGS: Brain: Bold mild central atrophy with minimal small vessel ischemic disease of periventricular white matter. Small lacunar infarcts along the anterior internal capsules bilaterally and left basal ganglia. No large vascular territory infarction. No intra-axial mass nor extra-axial fluid collection. No edema or midline shift. Vascular: Atherosclerosis of the cavernous carotid arteries and both vertebral arteries. No hyperdense vessels. Skull: No skull fracture nor primary bone lesions. Sinuses/Orbits: Clear paranasal sinuses and mastoids. Symmetric appearance of the orbits and globes. No acute retrobulbar abnormality. Other: None IMPRESSION: 1. Chronic appearing small vessel ischemic disease with mild central atrophy. 2. Small lacunar infarcts along the anterior inferior capsules bilaterally and left basal ganglia. 3. No acute intracranial abnormality. Electronically Signed   By: Tollie Eth M.D.   On: 12/16/2016 17:55    EKG: Independently reviewed. Sinus bradycardia (rate 57).   Assessment/Plan  1. Lacunar strokes - Pt presents with a few days of confusion, noted to be hypertensive to 200/130  - CT reveals lacunar infarcts bilaterally  - Neurology is consulting and much appreciated; tPA not considered d/t presentation beyond acceptable timeframe  - Plan to monitor on telemetry with frequent neuro checks, PT/OT/SLP evals - Obtain MRI brain, MRA head, carotid dopplers, echocardiogram, fasting lipids, A1c   2. Hypertension with hypertensive urgency  - BP 200/130 initially in ED, head CT as above  -  She is managed with Coreg and valsartan-HCTZ at home; these will be held as she is NPO pending swallow eval  - Per neurology recommendations, treat for SBP >180   - Continue labetalol IVP's prn SBP >180   3. Hypokalemia  - Serum potassium 3.0 on presentation and treated with 20 mEq IV potassium  - Given 20 mEq oral potassium if she passes swallow screen  - Continue telemetry monitoring and repeat chem panel in am   4. Hypothyroidism  - Appears stable, continue Synthroid    DVT prophylaxis: sq Lovenox  Code Status: Full  Family Communication: Significant other updated at bedside Disposition Plan: Admit to SDU at [facility] Consults called: Neurology Admission status: Inpatient    Briscoe Deutscher, MD [facility] Pager [phone]  If 7PM-7AM, please contact night-coverage [email or web address] Password [email or web address]  12/16/2016, 7:04 PM

## 2016-12-17 LAB — URINALYSIS, ROUTINE W REFLEX MICROSCOPIC
BILIRUBIN URINE: NEGATIVE
Bacteria, UA: NONE SEEN
GLUCOSE, UA: NEGATIVE mg/dL
KETONES UR: NEGATIVE mg/dL
Nitrite: POSITIVE — AB
Protein, ur: NEGATIVE mg/dL
Specific Gravity, Urine: 1.009 (ref 1.005–1.030)
pH: 6 (ref 5.0–8.0)

## 2016-12-17 LAB — AMMONIA: Ammonia: 36 umol/L — ABNORMAL HIGH (ref 9–35)

## 2016-12-17 LAB — TSH: TSH: 1.633 u[IU]/mL (ref 0.350–4.500)

## 2016-12-17 LAB — MRSA PCR SCREENING: MRSA by PCR: NEGATIVE

## 2016-12-17 LAB — RPR: RPR: NONREACTIVE

## 2016-12-17 LAB — VITAMIN B12: Vitamin B-12: 559 pg/mL (ref 180–914)

## 2016-12-17 MED ORDER — CARVEDILOL 6.25 MG PO TABS
6.2500 mg | ORAL_TABLET | Freq: Two times a day (BID) | ORAL | Status: DC
  Administered 2016-12-18: 6.25 mg via ORAL
  Filled 2016-12-17 (×2): qty 1

## 2016-12-17 MED ORDER — POTASSIUM CHLORIDE CRYS ER 20 MEQ PO TBCR
20.0000 meq | EXTENDED_RELEASE_TABLET | Freq: Once | ORAL | Status: AC
  Administered 2016-12-17: 20 meq via ORAL
  Filled 2016-12-17: qty 1

## 2016-12-17 MED ORDER — AMLODIPINE BESYLATE 10 MG PO TABS
10.0000 mg | ORAL_TABLET | Freq: Every day | ORAL | Status: DC
  Administered 2016-12-17 – 2016-12-18 (×2): 10 mg via ORAL
  Filled 2016-12-17 (×2): qty 1

## 2016-12-17 MED ORDER — MAGNESIUM SULFATE IN D5W 1-5 GM/100ML-% IV SOLN
1.0000 g | Freq: Once | INTRAVENOUS | Status: AC
  Administered 2016-12-17: 1 g via INTRAVENOUS
  Filled 2016-12-17: qty 100

## 2016-12-17 MED ORDER — DEXTROSE 5 % IV SOLN
1.0000 g | INTRAVENOUS | Status: DC
  Administered 2016-12-17 – 2016-12-18 (×2): 1 g via INTRAVENOUS
  Filled 2016-12-17 (×2): qty 10

## 2016-12-17 MED ORDER — HYDRALAZINE HCL 20 MG/ML IJ SOLN
10.0000 mg | Freq: Four times a day (QID) | INTRAMUSCULAR | Status: DC | PRN
Start: 2016-12-17 — End: 2016-12-18
  Administered 2016-12-17 – 2016-12-18 (×2): 10 mg via INTRAVENOUS
  Filled 2016-12-17 (×2): qty 1

## 2016-12-17 MED ORDER — POTASSIUM CHLORIDE CRYS ER 20 MEQ PO TBCR
40.0000 meq | EXTENDED_RELEASE_TABLET | Freq: Four times a day (QID) | ORAL | Status: AC
  Administered 2016-12-17 (×2): 40 meq via ORAL
  Filled 2016-12-17 (×2): qty 2

## 2016-12-17 NOTE — Consult Note (Signed)
Neurology Consultation Reason for Consult: Altered mental status Referring Physician: Opyd, T  CC: Altered mental status  History is obtained from: Chart review, patient  HPI: Julie [name] is a 83 y.o. female who presents with confusion this morning. She apparently was saying things that weren't right. She apparently was disoriented to time the emergency department. She was also noted to be severely hypertensive A head CT was obtained which shows chronic lacunar infarcts. An MRI was then obtained which demonstrated no acute findings.   ROS: A 14 point ROS was performed and is negative except as noted in the HPI.   Past Medical History:  Diagnosis Date  . Anxiety   . Arthritis    SEVERE OA LEFT HIP  - S/P RIGHT HIP REPLACEMENT / OCCASIONAL BACK AND NECK PAIN  . Complication of anesthesia    difficulty waking up  . Diverticulosis   . Gastritis   . GERD (gastroesophageal reflux disease)   . Hemorrhoids   . Hyperlipidemia   . Hypertension   . Hypothyroidism   . Osteoporosis      Family History  Problem Relation Age of Onset  . Uterine cancer Sister   . Breast cancer Sister   . Lung cancer Brother   . Cirrhosis Brother        drinker  . Alzheimer's disease Brother   . Colon cancer Neg Hx      Social History:  reports that she has never smoked. She has never used smokeless tobacco. She reports that she does not drink alcohol or use drugs.   Exam: Current vital signs: BP (!) 145/67 (BP Location: Left Arm) Comment: readjusted cuff  Pulse (!) 54   Temp 97.9 F (36.6 C) (Oral)   Resp 13   Ht 5\' 4"  (1.626 m)   Wt 68.9 kg (151 lb 14.4 oz)   SpO2 95%   BMI 26.07 kg/m  Vital signs in last 24 hours: Temp:  [97.9 F (36.6 C)-98.6 F (37 C)] 97.9 F (36.6 C) (06/04 2355) Pulse Rate:  [54-72] 54 (06/05 0112) Resp:  [11-22] 13 (06/05 0112) BP: (142-222)/(65-130) 145/67 (06/05 0112) SpO2:  [94 %-98 %] 95 % (06/05 0112) Weight:  [68.9 kg (151 lb 14.4 oz)] 68.9 kg  (151 lb 14.4 oz) (06/04 2355)   Physical Exam  Constitutional: Appears well-developed and well-nourished.  Psych: Affect appropriate to situation Eyes: No scleral injection HENT: No OP obstrucion Head: Normocephalic.  Cardiovascular: Normal rate and regular rhythm.  Respiratory: Effort normal and breath sounds normal to anterior ascultation GI: Soft.  No distension. There is no tenderness.  Skin: WDI  Neuro: Mental Status: Patient is awake, alert, oriented to person, place,  year, and situation. She gives the month as April Patient is able to give a clear and coherent history. No signs of aphasia or neglect Cranial Nerves: II: Visual Fields are full. Pupils are equal, round, and reactive to light.   III,IV, VI: EOMI without ptosis or diploplia.  V: Facial sensation is symmetric to temperature VII: Facial movement is symmetric.  VIII: hearing is intact to voice X: Uvula elevates symmetrically XI: Shoulder shrug is symmetric. XII: tongue is midline without atrophy or fasciculations.  Motor: Tone is normal. Bulk is normal. 5/5 strength was present in all four extremities.  Sensory: Sensation is symmetric to light touch and temperature in the arms and legs. Cerebellar: FNF and HKS are intact bilaterally   I have reviewed labs in epic and the results pertinent to this consultation  are: Mild hypokalemia  I have reviewed the images obtained: MRI brain-negative  Impression: 83 year old female with mild encephalopathy in the setting of severe hypertension. I think that hypertensive urgency is certainly a possibility. I would favor checking a urinalysis for UTI. She has some chronic small vessel disease and therefore would likely benefit from continued antiplatelet therapy. No evidence for acute ischemia.  Recommendations: 1) ammonia, UA 2) continue ASA 3) hypertension control   Ritta SlotMcNeill Kirkpatrick, MD [facility] [phone]  If 7pm- 7am, please page neurology  on call as listed in AMION.

## 2016-12-17 NOTE — ED Provider Notes (Signed)
[facility]-EMERGENCY DEPT Provider Note   CSN: [id number] Arrival date & time: 12/16/16  1705     History   Chief Complaint Chief Complaint  Patient presents with  . Hypertension  . AMS    HPI Julie [name] is a 83 y.o. female.  83yo F w/ PMH including HTN, HLD, GERD who p/w apprehension and altered mental status. The patient's blood pressure has been elevated for the past few days which she and boyfriend state is abnormal for her. She reports being compliant with her medicines. This morning when her boyfriend talked to her, he noted that she was confused and "talking out of her mind." She denies any headache, arm or leg weakness, numbness, vision changes, nausea, vomiting, fevers, urinary symptoms, or recent illness. No head injury.  LEVEL 5 CAVEAT DUE TO AMS   The history is provided by the patient and a friend.  Hypertension     Past Medical History:  Diagnosis Date  . Anxiety   . Arthritis    SEVERE OA LEFT HIP  - S/P RIGHT HIP REPLACEMENT / OCCASIONAL BACK AND NECK PAIN  . Complication of anesthesia    difficulty waking up  . Diverticulosis   . Gastritis   . GERD (gastroesophageal reflux disease)   . Hemorrhoids   . Hyperlipidemia   . Hypertension   . Hypothyroidism   . Osteoporosis     Patient Active Problem List   Diagnosis Date Noted  . Dementia 12/16/2016  . Essential hypertension 12/16/2016  . Hypertensive urgency 12/16/2016  . Hypothyroidism 12/16/2016  . Hypokalemia 12/16/2016  . Acute encephalopathy 12/16/2016  . Lacunar stroke (HCC) 12/16/2016  . Aseptic loosening of prosthetic hip (HCC) 04/07/2012  . Degenerative arthritis of hip 11/29/2011  . Fatty liver 02/12/2011  . Abnormal liver function tests 01/31/2011  . GERD (gastroesophageal reflux disease) 01/31/2011  . Constipation, slow transit 01/31/2011  . Chronic RUQ pain 01/31/2011  . Esophageal reflux 12/18/2010  . Rectal bleeding 12/18/2010  . Chronic epigastric pain 12/18/2010  .  Gastritis, chronic 12/18/2010  . Diverticulosis of colon (without mention of hemorrhage) 12/18/2010  . Liver function study, abnormal 12/18/2010    Past Surgical History:  Procedure Laterality Date  . ABDOMINAL HYSTERECTOMY    . BLADDER SURGERY     tacting and sling  . BONE SPURS     BOTH HEELS - NO SURGERY -JUST INJECTIONS  . FEMORAL HERNIA REPAIR    . JOINT REPLACEMENT     hip replacements times 2  . TONSILLECTOMY    . TONSILLECTOMY    . TOTAL HIP ARTHROPLASTY  11/29/2011   Procedure: TOTAL HIP ARTHROPLASTY ANTERIOR APPROACH;  Surgeon: Kathryne Hitch, MD;  Location: [facility] ORS;  Service: Orthopedics;  Laterality: Left;  . TOTAL HIP REVISION  04/07/2012   Procedure: TOTAL HIP REVISION;  Surgeon: Kathryne Hitch, MD;  Location: [facility] OR;  Service: Orthopedics;  Laterality: Right;  Revision right hip acetabular component  . VAGINAL HYSTERECTOMY      OB History    No data available       Home Medications    Prior to Admission medications   Medication Sig Start Date End Date Taking? Authorizing Provider  Calcium Carbonate-Vitamin D 600-400 MG-UNIT per tablet Take 1 tablet by mouth daily.    Yes [provider]  carvedilol (COREG) 12.5 MG tablet Take 1 tablet (12.5 mg total) by mouth 2 (two) times daily with a meal. 04/11/15  Yes Lyndal Pulley, MD  cholecalciferol (VITAMIN D)  1000 UNITS tablet Take 2,000 Units by mouth daily.   Yes [provider]  denosumab (PROLIA) 60 MG/ML SOLN injection Inject 60 mg into the skin every 6 (six) months.    Yes [provider]  levothyroxine (SYNTHROID, LEVOTHROID) 50 MCG tablet Take 50 mcg by mouth daily with breakfast.    Yes [provider]  memantine (NAMENDA) 10 MG tablet Take 10 mg by mouth 2 (two) times daily.    Yes [provider]  valsartan-hydrochlorothiazide (DIOVAN-HCT) 320-25 MG tablet Take 1 tablet by mouth daily.   Yes [provider]  amLODipine (NORVASC) 5 MG tablet  Take 2 tablets (10 mg total) by mouth daily with breakfast. Patient not taking: Reported on 12/16/2016 04/11/15   Lyndal PulleyKnott, Daniel, MD  aspirin EC 325 MG EC tablet Take 1 tablet (325 mg total) by mouth daily. Patient not taking: Reported on 04/11/2015 04/09/12   Kathryne HitchBlackman, Christopher Y, MD  omeprazole (PRILOSEC) 40 MG capsule TAKE 1 CAPSULE DAILY. Patient not taking: Reported on 04/11/2015 07/07/13   Mardella LaymanPatterson, David R, MD    Family History Family History  Problem Relation Age of Onset  . Uterine cancer Sister   . Breast cancer Sister   . Lung cancer Brother   . Cirrhosis Brother        drinker  . Alzheimer's disease Brother   . Colon cancer Neg Hx     Social History Social History  Substance Use Topics  . Smoking status: Never Smoker  . Smokeless tobacco: Never Used  . Alcohol use No     Allergies   Penicillins cross reactors and Sulfa drugs cross reactors   Review of Systems Review of Systems  Unable to perform ROS: Mental status change     Physical Exam Updated Vital Signs BP (!) 172/83   Pulse (!) 55   Temp 97.9 F (36.6 C) (Oral)   Resp 17   Ht 5\' 4"  (1.626 m)   Wt 68.9 kg (151 lb 14.4 oz)   SpO2 94%   BMI 26.07 kg/m   Physical Exam  Constitutional: She appears well-developed and well-nourished. No distress.  Awake, alert  HENT:  Head: Normocephalic and atraumatic.  Eyes: Conjunctivae and EOM are normal. Pupils are equal, round, and reactive to light.  Neck: Neck supple.  Cardiovascular: Normal rate, regular rhythm and normal heart sounds.   No murmur heard. Pulmonary/Chest: Effort normal and breath sounds normal. No respiratory distress.  Abdominal: Soft. Bowel sounds are normal. She exhibits no distension. There is no tenderness.  Musculoskeletal: She exhibits no edema.  Neurological: She is alert. She has normal reflexes. No cranial nerve deficit. She exhibits normal muscle tone.  Oriented to person and place, disoriented to time Fluent speech, normal  finger-to-nose testing, negative pronator drift, no clonus 5/5 strength and normal sensation x all 4 extremities  Skin: Skin is warm and dry.  Psychiatric:  Calm, cooperative  Nursing note and vitals reviewed.    ED Treatments / Results  Labs (all labs ordered are listed, but only abnormal results are displayed) Labs Reviewed  COMPREHENSIVE METABOLIC PANEL - Abnormal; Notable for the following:       Result Value   Potassium 3.0 (*)    Glucose, Bld 100 (*)    AST 48 (*)    All other components within normal limits  I-STAT CHEM 8, ED - Abnormal; Notable for the following:    Potassium 2.9 (*)    BUN 21 (*)    Calcium, Ion  1.07 (*)    All other components within normal limits  MRSA PCR SCREENING  PROTIME-INR  APTT  CBC  DIFFERENTIAL  HEMOGLOBIN A1C  LIPID PANEL  I-STAT TROPOININ, ED  CBG MONITORING, ED    EKG  EKG Interpretation  Date/Time:  Monday December 16 2016 17:37:01 EDT Ventricular Rate:  56 PR Interval:    QRS Duration: 91 QT Interval:  460 QTC Calculation: 444 R Axis:   -25 Text Interpretation:  Sinus rhythm Borderline left axis deviation Anterior infarct, old No significant change since last tracing Confirmed by Frederick Peers [phone]) on 12/16/2016 6:11:05 PM       Radiology Dg Chest 2 View  Result Date: 12/16/2016 CLINICAL DATA:  Hypertension. EXAM: CHEST  2 VIEW COMPARISON:  11/25/2011 FINDINGS: Cardiomediastinal silhouette is normal. Mediastinal contours appear intact. There is no evidence of focal airspace consolidation, pleural effusion or pneumothorax. Osseous structures are without acute abnormality. Soft tissues are grossly normal. IMPRESSION: No active cardiopulmonary disease. Electronically Signed   By: Ted Mcalpine M.D.   On: 12/16/2016 19:41   Ct Head Wo Contrast  Result Date: 12/16/2016 CLINICAL DATA:  Hypertension, altered mental status EXAM: CT HEAD WITHOUT CONTRAST TECHNIQUE: Contiguous axial images were obtained from the base of the  skull through the vertex without intravenous contrast. COMPARISON:  None. FINDINGS: Brain: Bold mild central atrophy with minimal small vessel ischemic disease of periventricular white matter. Small lacunar infarcts along the anterior internal capsules bilaterally and left basal ganglia. No large vascular territory infarction. No intra-axial mass nor extra-axial fluid collection. No edema or midline shift. Vascular: Atherosclerosis of the cavernous carotid arteries and both vertebral arteries. No hyperdense vessels. Skull: No skull fracture nor primary bone lesions. Sinuses/Orbits: Clear paranasal sinuses and mastoids. Symmetric appearance of the orbits and globes. No acute retrobulbar abnormality. Other: None IMPRESSION: 1. Chronic appearing small vessel ischemic disease with mild central atrophy. 2. Small lacunar infarcts along the anterior inferior capsules bilaterally and left basal ganglia. 3. No acute intracranial abnormality. Electronically Signed   By: Tollie Eth M.D.   On: 12/16/2016 17:55   Mr Brain Wo Contrast  Result Date: 12/16/2016 CLINICAL DATA:  Initial evaluation for acute stroke. EXAM: MRI HEAD WITHOUT CONTRAST MRA HEAD WITHOUT CONTRAST TECHNIQUE: Multiplanar, multiecho pulse sequences of the brain and surrounding structures were obtained without intravenous contrast. Angiographic images of the head were obtained using MRA technique without contrast. COMPARISON:  Prior CT from earlier same day. FINDINGS: MRI HEAD FINDINGS Brain: Study moderately limited by motion artifact and patient's inability to tolerate the full length of the exam. Additionally, susceptibility artifact at the left occipital scalp also limits evaluation. Diffuse prominence of the CSF containing spaces compatible with generalized cerebral atrophy. Patchy T2/FLAIR hyperintensity within the periventricular and deep white matter both cerebral hemispheres most consistent with chronic small vessel skin disease, mild for age.  Chronic microvascular ischemic changes present within the pons. No abnormal foci of restricted diffusion to suggest acute or subacute ischemia. Gray-white matter differentiation maintained. No evidence for acute or chronic intracranial hemorrhage. No areas of chronic infarction identified. No mass lesion, midline shift or mass effect. Ventricles normal size without evidence for hydrocephalus. No extra-axial fluid collection. Major dural sinuses are grossly patent. Pituitary gland suprasellar region within normal limits. Midline structures intact and normal. Vascular: Major intracranial vascular flow voids are grossly maintained. Skull and upper cervical spine: Craniocervical junction within normal limits. Multilevel degenerative spondylolysis noted within the visualized upper cervical spine without significant stenosis. Bone marrow  signal intensity within normal limits. Hyperostosis frontalis interna noted. No scalp soft tissue abnormality. Sinuses/Orbits: Globes and orbital soft tissues within normal limits. Paranasal sinuses are clear. No mastoid effusion. Inner ear structures normal. Other: None. MRA HEAD FINDINGS ANTERIOR CIRCULATION: Study moderately degraded by motion artifact. Distal cervical segments of the internal carotid arteries are patent with antegrade flow. Petrous, cavernous, and supraclinoid segments patent without flow-limiting stenosis. ICA termini patent. Right A1 segment dominant and widely patent. Hypoplastic left A1 segment noted, which likely accounts for the slightly diminutive left ICA is compared to the right. Anterior communicating artery grossly normal. Anterior cerebral arteries patent to their distal aspects without stenosis or occlusion. M1 segments patent without stenosis or occlusion. M1 segments are somewhat truncated bilaterally. No proximal M2 occlusion. Distal MCA branches well opacified and symmetric. POSTERIOR CIRCULATION: Vertebral arteries not well evaluated on this exam,  but grossly patent to the vertebrobasilar junction. Basilar artery patent without stenosis. Superior cerebral arteries patent proximally. Both of the PCA is primarily supplied via the basilar artery and are grossly patent to their distal aspects. Small right posterior communicating artery noted. No obvious aneurysm or vascular malformation. IMPRESSION: MRI HEAD IMPRESSION: 1. Moderately motion degraded study. No acute intracranial infarct or other process identified. 2. Mild age-related cerebral atrophy with chronic small vessel ischemic disease. MRA HEAD IMPRESSION: 1. Moderately motion degraded study. 2. Grossly negative intracranial MRA. No large vessel occlusion. No high-grade or correctable stenosis. Electronically Signed   By: Rise Mu M.D.   On: 12/16/2016 22:02   Mr Maxine Glenn Head/brain ZO Cm  Result Date: 12/16/2016 CLINICAL DATA:  Initial evaluation for acute stroke. EXAM: MRI HEAD WITHOUT CONTRAST MRA HEAD WITHOUT CONTRAST TECHNIQUE: Multiplanar, multiecho pulse sequences of the brain and surrounding structures were obtained without intravenous contrast. Angiographic images of the head were obtained using MRA technique without contrast. COMPARISON:  Prior CT from earlier same day. FINDINGS: MRI HEAD FINDINGS Brain: Study moderately limited by motion artifact and patient's inability to tolerate the full length of the exam. Additionally, susceptibility artifact at the left occipital scalp also limits evaluation. Diffuse prominence of the CSF containing spaces compatible with generalized cerebral atrophy. Patchy T2/FLAIR hyperintensity within the periventricular and deep white matter both cerebral hemispheres most consistent with chronic small vessel skin disease, mild for age. Chronic microvascular ischemic changes present within the pons. No abnormal foci of restricted diffusion to suggest acute or subacute ischemia. Gray-white matter differentiation maintained. No evidence for acute or chronic  intracranial hemorrhage. No areas of chronic infarction identified. No mass lesion, midline shift or mass effect. Ventricles normal size without evidence for hydrocephalus. No extra-axial fluid collection. Major dural sinuses are grossly patent. Pituitary gland suprasellar region within normal limits. Midline structures intact and normal. Vascular: Major intracranial vascular flow voids are grossly maintained. Skull and upper cervical spine: Craniocervical junction within normal limits. Multilevel degenerative spondylolysis noted within the visualized upper cervical spine without significant stenosis. Bone marrow signal intensity within normal limits. Hyperostosis frontalis interna noted. No scalp soft tissue abnormality. Sinuses/Orbits: Globes and orbital soft tissues within normal limits. Paranasal sinuses are clear. No mastoid effusion. Inner ear structures normal. Other: None. MRA HEAD FINDINGS ANTERIOR CIRCULATION: Study moderately degraded by motion artifact. Distal cervical segments of the internal carotid arteries are patent with antegrade flow. Petrous, cavernous, and supraclinoid segments patent without flow-limiting stenosis. ICA termini patent. Right A1 segment dominant and widely patent. Hypoplastic left A1 segment noted, which likely accounts for the slightly diminutive left ICA is compared  to the right. Anterior communicating artery grossly normal. Anterior cerebral arteries patent to their distal aspects without stenosis or occlusion. M1 segments patent without stenosis or occlusion. M1 segments are somewhat truncated bilaterally. No proximal M2 occlusion. Distal MCA branches well opacified and symmetric. POSTERIOR CIRCULATION: Vertebral arteries not well evaluated on this exam, but grossly patent to the vertebrobasilar junction. Basilar artery patent without stenosis. Superior cerebral arteries patent proximally. Both of the PCA is primarily supplied via the basilar artery and are grossly patent to  their distal aspects. Small right posterior communicating artery noted. No obvious aneurysm or vascular malformation. IMPRESSION: MRI HEAD IMPRESSION: 1. Moderately motion degraded study. No acute intracranial infarct or other process identified. 2. Mild age-related cerebral atrophy with chronic small vessel ischemic disease. MRA HEAD IMPRESSION: 1. Moderately motion degraded study. 2. Grossly negative intracranial MRA. No large vessel occlusion. No high-grade or correctable stenosis. Electronically Signed   By: Rise Mu M.D.   On: 12/16/2016 22:02    Procedures .Critical Care Performed by: Laurence Spates Authorized by: Laurence Spates   Critical care provider statement:    Critical care time (minutes):  45   Critical care time was exclusive of:  Separately billable procedures and treating other patients   Critical care was necessary to treat or prevent imminent or life-threatening deterioration of the following conditions:  CNS failure or compromise   Critical care was time spent personally by me on the following activities:  Development of treatment plan with patient or surrogate, discussions with consultants, evaluation of patient's response to treatment, examination of patient, interpretation of cardiac output measurements, obtaining history from patient or surrogate, ordering and performing treatments and interventions, ordering and review of laboratory studies, ordering and review of radiographic studies, re-evaluation of patient's condition and review of old charts   (including critical care time)  Medications Ordered in ED Medications  cholecalciferol (VITAMIN D) tablet 2,000 Units (not administered)  memantine (NAMENDA) tablet 10 mg (not administered)  levothyroxine (SYNTHROID, LEVOTHROID) tablet 50 mcg (not administered)   stroke: mapping our early stages of recovery book (not administered)  0.9 %  sodium chloride infusion ( Intravenous New Bag/Given 12/16/16  2142)  acetaminophen (TYLENOL) tablet 650 mg (not administered)    Or  acetaminophen (TYLENOL) solution 650 mg (not administered)    Or  acetaminophen (TYLENOL) suppository 650 mg (not administered)  senna-docusate (Senokot-S) tablet 1 tablet (not administered)  enoxaparin (LOVENOX) injection 40 mg (not administered)  aspirin suppository 300 mg ( Rectal See Alternative 12/16/16 2200)    Or  aspirin tablet 325 mg (325 mg Oral Given 12/16/16 2200)  labetalol (NORMODYNE,TRANDATE) injection 5-10 mg (not administered)  hydrALAZINE (APRESOLINE) injection 5-10 mg (not administered)  potassium phosphate 20 mEq in dextrose 5 % 250 mL infusion (20 mEq Intravenous New Bag/Given 12/16/16 2246)  nicardipine (CARDENE) 20mg  in 0.86% saline IV infusion (0.1 mg/ml) (0 mg/hr Intravenous Stopped 12/16/16 2225)  magnesium sulfate IVPB 1 g 100 mL (0 g Intravenous Stopped 12/16/16 2258)     Initial Impression / Assessment and Plan / ED Course  I have reviewed the triage vital signs and the nursing notes.  Pertinent labs & imaging results that were available during my care of the patient were reviewed by me and considered in my medical decision making (see chart for details).     Pt w/ A few days of hypertension and altered mental status noted by boyfriend today. On exam, she was awake and alert, protecting airway, disoriented to  time but the remainder of her neurologic exam was reassuring. She denies any complaints. Initial blood pressure was 206/130. Differential is broad and includes hypertensive encephalopathy, intracranial hemorrhage, CVA, less likely infectious process.  Head CT shows lacunar infarcts in inferior capsules bilaterally, left basal ganglia. Labwork notable only for hypokalemia with potassium of 3. I discussed with neurologist, Dr. Roxy Manns, who recommended transfer to [facility] for admission and neurology evaluation. He recommended conservative blood pressure control with goal sBP 180. Discussed  transfer admission with [facility] hospitalist, Dr. Antionette Char, and pt transferred to [facility] for further care.  Final Clinical Impressions(s) / ED Diagnoses   Final diagnoses:  Lacunar stroke (HCC)  Lacunar stroke (HCC)  Lacunar stroke [facility])    New Prescriptions Current Discharge Medication List       Little, Ambrose Finland, MD 12/17/16 [phone]

## 2016-12-17 NOTE — Progress Notes (Signed)
OT Cancellation Note  Patient Details Name: Julie [name] MRN: [id number] DOB: [birth year]   Cancelled Treatment:    Reason Eval/Treat Not Completed: Medical issues which prohibited therapy;Patient at procedure or test/ unavailable.  Pt working with PT first attempt, and on second attempt, pt with elevated BP.  Will reattempt.  Lido Maske [place], OTR/L [phone]   Jeani HawkingConarpe, Florrie Ramires M 12/17/2016, 1:05 PM

## 2016-12-17 NOTE — Progress Notes (Signed)
@IPLOG         PROGRESS NOTE                                                                                                                                                                                                             Patient Demographics:    Julie [name], is a 83 y.o. female, DOB - 1933-06-06, XBJ:[id number]  Admit date - 12/16/2016   Admitting Physician Briscoe Deutscher, MD  Outpatient Primary MD for the patient is Willow Ora, MD  LOS - 1  Chief Complaint  Patient presents with  . Hypertension  . AMS       Brief Narrative   OLIVINE HIERS is a 83 y.o. female with medical history significant for hypothyroidism, dementia, and hypertension, now presenting to the emergency department for several days of confusion, which was worse than her baseline for about 4 days prior to her hospital admission, she was seen by neurology at the hospital, underwent CT scan and MRI of the brain which were nonacute, workup did show UTI likely contributing to her symptoms. She presented to [facility] and was transferred to [facility] on 12/17/2016 for possible stroke workup.   Subjective:    Julie [name] today has, No headache, No chest pain, No abdominal pain - No Nausea, No new weakness tingling or numbness, No Cough - SOB.     Assessment  & Plan :     1. Encephalopathy/delirium in the setting of dementia. Likely brought on by UTI, placed on Rocephin, follow urine cultures, minimize narcotics and benzodiazepines, continue Namenda for baseline dementia. PT eval advance her Activity, may require placement. We'll also check baseline B-12 and RPR, TSH was stable.  2. Dementia. Continue Namenda.  3. Hypertension. Poorly controlled, placed on Norvasc along with Coreg and as needed hydralazine. Monitor  4. History of hypothyroidism on Synthroid, stable TSH.  5. Hypokalemia. Replaced will recheck in the morning.    Diet : Diet Heart Room service appropriate? Yes;  Fluid consistency: Thin    Family Communication  : Left message for sun on his listed cell phone on 12/18/2078 @ 9 AM  Code Status :  Full  Disposition Plan  :  TBD  Consults  :  Neuro  Procedures  :    MRI-A Brain, CT Head - Non acute  DVT Prophylaxis  :  Lovenox    Lab Results  Component Value Date   PLT 192 12/16/2016    Inpatient Medications  Scheduled Meds: .  stroke: mapping our early stages of recovery book   Does not apply Once  . amLODipine  10 mg Oral Daily  . aspirin  325 mg Oral Daily  . carvedilol  6.25 mg Oral BID WC  . cholecalciferol  2,000 Units Oral Daily  . enoxaparin (LOVENOX) injection  40 mg Subcutaneous QHS  . levothyroxine  50 mcg Oral Q breakfast  . memantine  10 mg Oral BID  . potassium chloride  40 mEq Oral Q6H   Continuous Infusions: . cefTRIAXone (ROCEPHIN)  IV    . magnesium sulfate 1 - 4 g bolus IVPB     PRN Meds:.acetaminophen **OR** [DISCONTINUED] acetaminophen (TYLENOL) oral liquid 160 mg/5 mL **OR** [DISCONTINUED] acetaminophen, hydrALAZINE, labetalol, senna-docusate  Antibiotics  :    Anti-infectives    Start     Dose/Rate Route Frequency Ordered Stop   12/17/16 0800  cefTRIAXone (ROCEPHIN) 1 g in dextrose 5 % 50 mL IVPB     1 g 100 mL/hr over 30 Minutes Intravenous Every 24 hours 12/17/16 0757           Objective:   Vitals:   12/17/16 0425 12/17/16 0500 12/17/16 0600 12/17/16 0809  BP: 137/67 135/74 139/80 (!) 190/104  Pulse: (!) 55 (!) 53 (!) 54 83  Resp: 15 14 12 14   Temp: 97.7 F (36.5 C)   97.8 F (36.6 C)  TempSrc: Oral   Oral  SpO2: 97% 96% 97% 98%  Weight:      Height:        Wt Readings from Last 3 Encounters:  12/16/16 68.9 kg (151 lb 14.4 oz)  04/11/15 64.4 kg (142 lb)  03/31/12 65 kg (143 lb 4.8 oz)     Intake/Output Summary (Last 24 hours) at 12/17/16 0857 Last data filed at 12/17/16 0831  Gross per 24 hour  Intake           977.05 ml  Output             1650 ml  Net          -672.95 ml      Physical Exam  Awake, mildly confused, Oriented to place, No new F.N deficits, Normal affect [place].AT,PERRAL Supple Neck,No JVD, No cervical lymphadenopathy appriciated.  Symmetrical Chest wall movement, Good air movement bilaterally, CTAB RRR,No Gallops,Rubs or new Murmurs, No Parasternal Heave +ve B.Sounds, Abd Soft, No tenderness, No organomegaly appriciated, No rebound - guarding or rigidity. No Cyanosis, Clubbing or edema, No new Rash or bruise       Data Review:    CBC  Recent Labs Lab 12/16/16 1719 12/16/16 1747  WBC 8.1  --   HGB 14.7 13.9  HCT 41.5 41.0  PLT 192  --   MCV 90.4  --   MCH 32.0  --   MCHC 35.4  --   RDW 13.3  --   LYMPHSABS 2.3  --   MONOABS 0.6  --   EOSABS 0.0  --   BASOSABS 0.0  --     Chemistries   Recent Labs Lab 12/16/16 1719 12/16/16 1747  NA 141 141  K 3.0* 2.9*  CL 104 104  CO2 26  --   GLUCOSE 100* 97  BUN 20 21*  CREATININE 0.65 0.70  CALCIUM 9.2  --   AST 48*  --   ALT 18  --   ALKPHOS 39  --   BILITOT 0.5  --    ------------------------------------------------------------------------------------------------------------------ No results for input(s): CHOL, HDL, LDLCALC,  TRIG, CHOLHDL, LDLDIRECT in the last 72 hours.  No results found for: HGBA1C ------------------------------------------------------------------------------------------------------------------  Recent Labs  12/17/16 0306  TSH 1.633   ------------------------------------------------------------------------------------------------------------------ No results for input(s): VITAMINB12, FOLATE, FERRITIN, TIBC, IRON, RETICCTPCT in the last 72 hours.  Coagulation profile  Recent Labs Lab 12/16/16 1719  INR 1.04    No results for input(s): DDIMER in the last 72 hours.  Cardiac Enzymes No results for input(s): CKMB, TROPONINI, MYOGLOBIN in the last 168 hours.  Invalid input(s):  CK ------------------------------------------------------------------------------------------------------------------ No results found for: BNP  Micro Results Recent Results (from the past 240 hour(s))  MRSA PCR Screening     Status: None   Collection Time: 12/17/16 12:27 AM  Result Value Ref Range Status   MRSA by PCR NEGATIVE NEGATIVE Final    Comment:        The GeneXpert MRSA Assay (FDA approved for NASAL specimens only), is one component of a comprehensive MRSA colonization surveillance program. It is not intended to diagnose MRSA infection nor to guide or monitor treatment for MRSA infections.     Radiology Reports Dg Chest 2 View  Result Date: 12/16/2016 CLINICAL DATA:  Hypertension. EXAM: CHEST  2 VIEW COMPARISON:  11/25/2011 FINDINGS: Cardiomediastinal silhouette is normal. Mediastinal contours appear intact. There is no evidence of focal airspace consolidation, pleural effusion or pneumothorax. Osseous structures are without acute abnormality. Soft tissues are grossly normal. IMPRESSION: No active cardiopulmonary disease. Electronically Signed   By: Ted Mcalpineobrinka  Dimitrova M.D.   On: 12/16/2016 19:41   Ct Head Wo Contrast  Result Date: 12/16/2016 CLINICAL DATA:  Hypertension, altered mental status EXAM: CT HEAD WITHOUT CONTRAST TECHNIQUE: Contiguous axial images were obtained from the base of the skull through the vertex without intravenous contrast. COMPARISON:  None. FINDINGS: Brain: Bold mild central atrophy with minimal small vessel ischemic disease of periventricular white matter. Small lacunar infarcts along the anterior internal capsules bilaterally and left basal ganglia. No large vascular territory infarction. No intra-axial mass nor extra-axial fluid collection. No edema or midline shift. Vascular: Atherosclerosis of the cavernous carotid arteries and both vertebral arteries. No hyperdense vessels. Skull: No skull fracture nor primary bone lesions. Sinuses/Orbits: Clear  paranasal sinuses and mastoids. Symmetric appearance of the orbits and globes. No acute retrobulbar abnormality. Other: None IMPRESSION: 1. Chronic appearing small vessel ischemic disease with mild central atrophy. 2. Small lacunar infarcts along the anterior inferior capsules bilaterally and left basal ganglia. 3. No acute intracranial abnormality. Electronically Signed   By: Tollie Ethavid  Kwon M.D.   On: 12/16/2016 17:55   Mr Brain Wo Contrast  Result Date: 12/16/2016 CLINICAL DATA:  Initial evaluation for acute stroke. EXAM: MRI HEAD WITHOUT CONTRAST MRA HEAD WITHOUT CONTRAST TECHNIQUE: Multiplanar, multiecho pulse sequences of the brain and surrounding structures were obtained without intravenous contrast. Angiographic images of the head were obtained using MRA technique without contrast. COMPARISON:  Prior CT from earlier same day. FINDINGS: MRI HEAD FINDINGS Brain: Study moderately limited by motion artifact and patient's inability to tolerate the full length of the exam. Additionally, susceptibility artifact at the left occipital scalp also limits evaluation. Diffuse prominence of the CSF containing spaces compatible with generalized cerebral atrophy. Patchy T2/FLAIR hyperintensity within the periventricular and deep white matter both cerebral hemispheres most consistent with chronic small vessel skin disease, mild for age. Chronic microvascular ischemic changes present within the pons. No abnormal foci of restricted diffusion to suggest acute or subacute ischemia. Gray-white matter differentiation maintained. No evidence for acute or chronic intracranial hemorrhage. No  areas of chronic infarction identified. No mass lesion, midline shift or mass effect. Ventricles normal size without evidence for hydrocephalus. No extra-axial fluid collection. Major dural sinuses are grossly patent. Pituitary gland suprasellar region within normal limits. Midline structures intact and normal. Vascular: Major intracranial  vascular flow voids are grossly maintained. Skull and upper cervical spine: Craniocervical junction within normal limits. Multilevel degenerative spondylolysis noted within the visualized upper cervical spine without significant stenosis. Bone marrow signal intensity within normal limits. Hyperostosis frontalis interna noted. No scalp soft tissue abnormality. Sinuses/Orbits: Globes and orbital soft tissues within normal limits. Paranasal sinuses are clear. No mastoid effusion. Inner ear structures normal. Other: None. MRA HEAD FINDINGS ANTERIOR CIRCULATION: Study moderately degraded by motion artifact. Distal cervical segments of the internal carotid arteries are patent with antegrade flow. Petrous, cavernous, and supraclinoid segments patent without flow-limiting stenosis. ICA termini patent. Right A1 segment dominant and widely patent. Hypoplastic left A1 segment noted, which likely accounts for the slightly diminutive left ICA is compared to the right. Anterior communicating artery grossly normal. Anterior cerebral arteries patent to their distal aspects without stenosis or occlusion. M1 segments patent without stenosis or occlusion. M1 segments are somewhat truncated bilaterally. No proximal M2 occlusion. Distal MCA branches well opacified and symmetric. POSTERIOR CIRCULATION: Vertebral arteries not well evaluated on this exam, but grossly patent to the vertebrobasilar junction. Basilar artery patent without stenosis. Superior cerebral arteries patent proximally. Both of the PCA is primarily supplied via the basilar artery and are grossly patent to their distal aspects. Small right posterior communicating artery noted. No obvious aneurysm or vascular malformation. IMPRESSION: MRI HEAD IMPRESSION: 1. Moderately motion degraded study. No acute intracranial infarct or other process identified. 2. Mild age-related cerebral atrophy with chronic small vessel ischemic disease. MRA HEAD IMPRESSION: 1. Moderately motion  degraded study. 2. Grossly negative intracranial MRA. No large vessel occlusion. No high-grade or correctable stenosis. Electronically Signed   By: Rise Mu M.D.   On: 12/16/2016 22:02   Mr Maxine Glenn Head/brain BJ Cm  Result Date: 12/16/2016 CLINICAL DATA:  Initial evaluation for acute stroke. EXAM: MRI HEAD WITHOUT CONTRAST MRA HEAD WITHOUT CONTRAST TECHNIQUE: Multiplanar, multiecho pulse sequences of the brain and surrounding structures were obtained without intravenous contrast. Angiographic images of the head were obtained using MRA technique without contrast. COMPARISON:  Prior CT from earlier same day. FINDINGS: MRI HEAD FINDINGS Brain: Study moderately limited by motion artifact and patient's inability to tolerate the full length of the exam. Additionally, susceptibility artifact at the left occipital scalp also limits evaluation. Diffuse prominence of the CSF containing spaces compatible with generalized cerebral atrophy. Patchy T2/FLAIR hyperintensity within the periventricular and deep white matter both cerebral hemispheres most consistent with chronic small vessel skin disease, mild for age. Chronic microvascular ischemic changes present within the pons. No abnormal foci of restricted diffusion to suggest acute or subacute ischemia. Gray-white matter differentiation maintained. No evidence for acute or chronic intracranial hemorrhage. No areas of chronic infarction identified. No mass lesion, midline shift or mass effect. Ventricles normal size without evidence for hydrocephalus. No extra-axial fluid collection. Major dural sinuses are grossly patent. Pituitary gland suprasellar region within normal limits. Midline structures intact and normal. Vascular: Major intracranial vascular flow voids are grossly maintained. Skull and upper cervical spine: Craniocervical junction within normal limits. Multilevel degenerative spondylolysis noted within the visualized upper cervical spine without  significant stenosis. Bone marrow signal intensity within normal limits. Hyperostosis frontalis interna noted. No scalp soft tissue abnormality. Sinuses/Orbits: Globes and orbital  soft tissues within normal limits. Paranasal sinuses are clear. No mastoid effusion. Inner ear structures normal. Other: None. MRA HEAD FINDINGS ANTERIOR CIRCULATION: Study moderately degraded by motion artifact. Distal cervical segments of the internal carotid arteries are patent with antegrade flow. Petrous, cavernous, and supraclinoid segments patent without flow-limiting stenosis. ICA termini patent. Right A1 segment dominant and widely patent. Hypoplastic left A1 segment noted, which likely accounts for the slightly diminutive left ICA is compared to the right. Anterior communicating artery grossly normal. Anterior cerebral arteries patent to their distal aspects without stenosis or occlusion. M1 segments patent without stenosis or occlusion. M1 segments are somewhat truncated bilaterally. No proximal M2 occlusion. Distal MCA branches well opacified and symmetric. POSTERIOR CIRCULATION: Vertebral arteries not well evaluated on this exam, but grossly patent to the vertebrobasilar junction. Basilar artery patent without stenosis. Superior cerebral arteries patent proximally. Both of the PCA is primarily supplied via the basilar artery and are grossly patent to their distal aspects. Small right posterior communicating artery noted. No obvious aneurysm or vascular malformation. IMPRESSION: MRI HEAD IMPRESSION: 1. Moderately motion degraded study. No acute intracranial infarct or other process identified. 2. Mild age-related cerebral atrophy with chronic small vessel ischemic disease. MRA HEAD IMPRESSION: 1. Moderately motion degraded study. 2. Grossly negative intracranial MRA. No large vessel occlusion. No high-grade or correctable stenosis. Electronically Signed   By: Rise Mu M.D.   On: 12/16/2016 22:02    Time Spent in  minutes  30   Susa Raring M.D on 12/17/2016 at 8:57 AM  Between 7am to 7pm - Pager - [phone] ( page via [email or web address], text pages only, please mention full 10 digit call back number). After 7pm go to [email or web address] - password [email or web address]

## 2016-12-17 NOTE — Care Management Note (Addendum)
Case Management Note  Patient Details  Name: Julie [name] MRN: [id number] Date of Birth: 1933/01/23  Subjective/Objective:  From home alone, presents with encephalopahthy/delirium with dementia, uti, htn, hypokalemia. Per pt eval rec HHPT with 24 hr care.  Patient has a boyfriend who comes over frequently and [place]  [address], [place] [id number], home phone is [phone].  She has [employer] insurance. NCM spoke with boyfriend Lyda JesterCurtis, he states he will be with her for 24 hrs per day and he she will be staying with him. NCM offered [facility] to [facility] [place] [facility] [place].  [facility], [facility] made [facility] [place] [facility]), PT, OT, aide and Social Worker and rolling walker.  Soc will begin 24-48 hrs post dc.  Patient is for dc today.   PCP Asencion Partridgeamille Andy                  Action/Plan: NCM will follow for dc needs.   Expected Discharge Date:                  Expected Discharge Plan:     In-House Referral:     Discharge planning Services  CM Consult  Post Acute Care Choice:    Choice offered to:     DME Arranged:    DME Agency:     HH Arranged:    HH Agency:     Status of Service:  In process, will continue to follow  If discussed at Long Length of Stay Meetings, dates discussed:    Additional Comments:  Leone Havenaylor, Audry Pecina Clinton, RN 12/17/2016, 5:13 PM

## 2016-12-17 NOTE — Evaluation (Signed)
Speech Language Pathology Evaluation Patient Details Name: Julie [name] MRN: [id number] DOB: 19-Aug-1932 Today's Date: 12/17/2016 Time: [id number] SLP Time Calculation (min) (ACUTE ONLY): 31 min  Problem List:  Patient Active Problem List   Diagnosis Date Noted  . Dementia 12/16/2016  . Essential hypertension 12/16/2016  . Hypertensive urgency 12/16/2016  . Hypothyroidism 12/16/2016  . Hypokalemia 12/16/2016  . Acute encephalopathy 12/16/2016  . Lacunar stroke (HCC) 12/16/2016  . Aseptic loosening of prosthetic hip (HCC) 04/07/2012  . Degenerative arthritis of hip 11/29/2011  . Fatty liver 02/12/2011  . Abnormal liver function tests 01/31/2011  . GERD (gastroesophageal reflux disease) 01/31/2011  . Constipation, slow transit 01/31/2011  . Chronic RUQ pain 01/31/2011  . Esophageal reflux 12/18/2010  . Rectal bleeding 12/18/2010  . Chronic epigastric pain 12/18/2010  . Gastritis, chronic 12/18/2010  . Diverticulosis of colon (without mention of hemorrhage) 12/18/2010  . Liver function study, abnormal 12/18/2010   Past Medical History:  Past Medical History:  Diagnosis Date  . Anxiety   . Arthritis    SEVERE OA LEFT HIP  - S/P RIGHT HIP REPLACEMENT / OCCASIONAL BACK AND NECK PAIN  . Complication of anesthesia    difficulty waking up  . Diverticulosis   . Gastritis   . GERD (gastroesophageal reflux disease)   . Hemorrhoids   . Hyperlipidemia   . Hypertension   . Hypothyroidism   . Osteoporosis    Past Surgical History:  Past Surgical History:  Procedure Laterality Date  . ABDOMINAL HYSTERECTOMY    . BLADDER SURGERY     tacting and sling  . BONE SPURS     BOTH HEELS - NO SURGERY -JUST INJECTIONS  . FEMORAL HERNIA REPAIR    . JOINT REPLACEMENT     hip replacements times 2  . TONSILLECTOMY    . TONSILLECTOMY    . TOTAL HIP ARTHROPLASTY  11/29/2011   Procedure: TOTAL HIP ARTHROPLASTY ANTERIOR APPROACH;  Surgeon: Kathryne Hitchhristopher Y Blackman, MD;  Location: [facility] ORS;   Service: Orthopedics;  Laterality: Left;  . TOTAL HIP REVISION  04/07/2012   Procedure: TOTAL HIP REVISION;  Surgeon: Kathryne Hitchhristopher Y Blackman, MD;  [facility];  Service: Orthopedics;  Laterality: Right;  Revision right hip acetabular component  . VAGINAL HYSTERECTOMY     HPI:  Pt is an 83 y.o. female with medical history significant for hypothyroidism, dementia, and hypertension, admitted due to several days of confusion and elevated blood pressure. MRI negative for acute infarct but noted chronic bilateral lacunar infarcts. Work up also positive for UTI.   Assessment / Plan / Recommendation Clinical Impression  Pt has significant cognitive deficits in the areas of orientation, storage and retrieval of new information, basic problem solving, and intellectual and safety awareness. PTA she was living independently with intermittent assist from her boyfriend, but still driving and managing her own home. Cognitively, she would not be safe to return home without 24/7 supervision. Her son is interested in pursuing options to provide this level of supervision, as he acknowledges her cognitive decline (shared his concerns with RN and SW). Will continue to follow acutely to maximize safety and provide strategies for pt/family.    SLP Assessment  SLP Recommendation/Assessment: Patient needs continued Speech Lanaguage Pathology Services SLP Visit Diagnosis: Cognitive communication deficit (R41.841)    Follow Up Recommendations  24 hour supervision/assistance;Skilled Nursing facility    Frequency and Duration min 2x/week  1 week      SLP Evaluation Cognition  Overall Cognitive Status: Impaired/Different from baseline  Arousal/Alertness: Awake/alert Orientation Level: Oriented to person;Oriented to place;Disoriented to time;Disoriented to situation Memory: Impaired Memory Impairment: Storage deficit;Retrieval deficit;Decreased recall of new information Awareness: Impaired Awareness Impairment:  Intellectual impairment;Emergent impairment;Anticipatory impairment Problem Solving: Impaired Problem Solving Impairment: Functional basic;Verbal basic Safety/Judgment: Impaired       Comprehension  Auditory Comprehension Overall Auditory Comprehension: Appears within functional limits for tasks assessed    Expression Expression Primary Mode of Expression: Verbal Verbal Expression Overall Verbal Expression: Appears within functional limits for tasks assessed   Oral / Motor  Motor Speech Overall Motor Speech: Appears within functional limits for tasks assessed   GO                    Maxcine Ham 12/17/2016, 3:50 PM  Maxcine Ham, M.A. CCC-SLP [phone]

## 2016-12-17 NOTE — Evaluation (Signed)
Physical Therapy Evaluation Patient Details Name: Julie [name] MRN: [id number] DOB: [birth year] Today's Date: 12/17/2016   History of Present Illness  Julie [name] is a 83 y.o. female with medical history significant for hypothyroidism, dementia, and hypertension, admitted due to several days of confusion and elevated blood pressure.  MRI negative for acute infarct but noted chronic bilateral lacunar infarcts.  Work up also positive for UTI.  Clinical Impression  Patient presents with decreased independence with mobility due to deficits listed in PT problem list.  She will benefit from skilled PT in the acute setting to allow return home with initial 24 hour S and follow up HHPT.     Follow Up Recommendations Supervision/Assistance - 24 hour;Home health PT    Equipment Recommendations  Rolling walker with 5" wheels    Recommendations for Other Services       Precautions / Restrictions Precautions Precautions: Fall Restrictions Weight Bearing Restrictions: No      Mobility  Bed Mobility               General bed mobility comments: up in chair  Transfers Overall transfer level: Needs assistance Equipment used: None Transfers: Sit to/from Stand Sit to Stand: Min assist         General transfer comment: initially no assist and pt fell back into chair, assist for balance upon standing with imbalance once on her feet  Ambulation/Gait Ambulation/Gait assistance: Min guard;Min assist Ambulation Distance (Feet): 225 Feet Assistive device: Rolling walker (2 wheeled);None Gait Pattern/deviations: Step-through pattern;Decreased stride length;Shuffle     General Gait Details: initially shuffling with reaching for hand hold in room with min A, then with walker progressed in hallway to S level and able to take turns with walker in room no LOB  Stairs            Wheelchair Mobility    Modified Rankin (Stroke Patients Only) Modified Rankin (Stroke Patients  Only) Pre-Morbid Rankin Score: No significant disability Modified Rankin: Moderately severe disability     Balance Overall balance assessment: Needs assistance   Sitting balance-Leahy Scale: Good     Standing balance support: No upper extremity supported Standing balance-Leahy Scale: Poor Standing balance comment: needs UE support for balance at this time                             Pertinent Vitals/Pain Pain Assessment: No/denies pain    Home Living Family/patient expects to be discharged to:: Private residence Living Arrangements: Alone Available Help at Discharge: Friend(s) Type of Home: House Home Access: Stairs to enter Entrance Stairs-Rails: Right Entrance Stairs-Number of Steps: 3 Home Layout: One level Home Equipment: None Additional Comments: Reports her boyfriend comes over frequently, but states doing all household management on her own; has a son who lives in [place]    Prior Function Level of Independence: Independent               Hand Dominance        Extremity/Trunk Assessment   Upper Extremity Assessment Upper Extremity Assessment: Defer to OT evaluation    Lower Extremity Assessment Lower Extremity Assessment: Generalized weakness       Communication   Communication: No difficulties  Cognition Arousal/Alertness: Awake/alert Behavior During Therapy: WFL for tasks assessed/performed Overall Cognitive Status: Impaired/Different from baseline Area of Impairment: Orientation                 Orientation Level: Disoriented to;Situation  General Comments: not sure why she is here nor the events preceding her admission except that she was cleaning out some cabinets in her kitchen      General Comments General comments (skin integrity, edema, etc.): BP initially 177/114, RN delivered IV medication, after ambulation BP 171/86    Exercises     Assessment/Plan    PT Assessment Patient needs continued  PT services  PT Problem List Decreased strength;Decreased balance;Decreased activity tolerance;Decreased mobility;Decreased knowledge of use of DME;Decreased cognition;Decreased safety awareness       PT Treatment Interventions DME instruction;Gait training;Balance training;Functional mobility training;Stair training;Therapeutic exercise;Therapeutic activities;Patient/family education    PT Goals (Current goals can be found in the Care Plan section)  Acute Rehab PT Goals Patient Stated Goal: To return home PT Goal Formulation: With patient Time For Goal Achievement: 12/24/16 Potential to Achieve Goals: Good    Frequency Min 4X/week   Barriers to discharge Decreased caregiver support      Co-evaluation               AM-PAC PT "6 Clicks" Daily Activity  Outcome Measure Difficulty turning over in bed (including adjusting bedclothes, sheets and blankets)?: None Difficulty moving from lying on back to sitting on the side of the bed? : None Difficulty sitting down on and standing up from a chair with arms (e.g., wheelchair, bedside commode, etc,.)?: Total Help needed moving to and from a bed to chair (including a wheelchair)?: A Little Help needed walking in hospital room?: A Little Help needed climbing 3-5 steps with a railing? : A Little 6 Click Score: 18    End of Session Equipment Utilized During Treatment: Gait belt Activity Tolerance: Patient tolerated treatment well Patient left: in chair;with call bell/phone within reach   PT Visit Diagnosis: Other abnormalities of gait and mobility (R26.89)    Time: [id number] PT Time Calculation (min) (ACUTE ONLY): 25 min   Charges:   PT Evaluation $PT Eval Moderate Complexity: 1 Procedure PT Treatments $Gait Training: 8-22 mins   PT G CodesSheran Lawless:        Cyndi Bostyn [place], [place] CarolinaPT [phone] 12/17/2016   Elray Mcgregorynthia Macaiah Mangal 12/17/2016, 12:12 PM

## 2016-12-18 DIAGNOSIS — I10 Essential (primary) hypertension: Secondary | ICD-10-CM

## 2016-12-18 DIAGNOSIS — N390 Urinary tract infection, site not specified: Secondary | ICD-10-CM | POA: Diagnosis present

## 2016-12-18 DIAGNOSIS — E039 Hypothyroidism, unspecified: Secondary | ICD-10-CM

## 2016-12-18 DIAGNOSIS — G9340 Encephalopathy, unspecified: Secondary | ICD-10-CM

## 2016-12-18 DIAGNOSIS — K219 Gastro-esophageal reflux disease without esophagitis: Secondary | ICD-10-CM

## 2016-12-18 DIAGNOSIS — E876 Hypokalemia: Secondary | ICD-10-CM

## 2016-12-18 DIAGNOSIS — I639 Cerebral infarction, unspecified: Secondary | ICD-10-CM

## 2016-12-18 DIAGNOSIS — F0390 Unspecified dementia without behavioral disturbance: Secondary | ICD-10-CM

## 2016-12-18 LAB — CBC
HCT: 42.6 % (ref 36.0–46.0)
HEMOGLOBIN: 14.9 g/dL (ref 12.0–15.0)
MCH: 32 pg (ref 26.0–34.0)
MCHC: 35 g/dL (ref 30.0–36.0)
MCV: 91.4 fL (ref 78.0–100.0)
Platelets: 197 10*3/uL (ref 150–400)
RBC: 4.66 MIL/uL (ref 3.87–5.11)
RDW: 13.5 % (ref 11.5–15.5)
WBC: 9.2 10*3/uL (ref 4.0–10.5)

## 2016-12-18 LAB — BASIC METABOLIC PANEL
ANION GAP: 10 (ref 5–15)
BUN: 11 mg/dL (ref 6–20)
CO2: 23 mmol/L (ref 22–32)
Calcium: 8.5 mg/dL — ABNORMAL LOW (ref 8.9–10.3)
Chloride: 108 mmol/L (ref 101–111)
Creatinine, Ser: 0.61 mg/dL (ref 0.44–1.00)
GLUCOSE: 102 mg/dL — AB (ref 65–99)
POTASSIUM: 3.5 mmol/L (ref 3.5–5.1)
Sodium: 141 mmol/L (ref 135–145)

## 2016-12-18 LAB — MAGNESIUM: Magnesium: 2.4 mg/dL (ref 1.7–2.4)

## 2016-12-18 MED ORDER — HYDROCHLOROTHIAZIDE 12.5 MG PO CAPS
12.5000 mg | ORAL_CAPSULE | Freq: Every day | ORAL | Status: DC
  Administered 2016-12-18: 12.5 mg via ORAL
  Filled 2016-12-18: qty 1

## 2016-12-18 MED ORDER — LABETALOL HCL 5 MG/ML IV SOLN
10.0000 mg | Freq: Once | INTRAVENOUS | Status: AC
  Administered 2016-12-18: 10 mg via INTRAVENOUS
  Filled 2016-12-18: qty 4

## 2016-12-18 MED ORDER — CARVEDILOL 12.5 MG PO TABS: 12.5000 mg | ORAL_TABLET | Freq: Two times a day (BID) | ORAL | Status: DC

## 2016-12-18 MED ORDER — AMLODIPINE BESYLATE 10 MG PO TABS: 10.0000 mg | ORAL_TABLET | Freq: Every day | ORAL | 0 refills | Status: DC

## 2016-12-18 MED ORDER — LOSARTAN POTASSIUM 50 MG PO TABS
50.0000 mg | ORAL_TABLET | Freq: Every day | ORAL | Status: DC
  Administered 2016-12-18: 50 mg via ORAL
  Filled 2016-12-18: qty 1

## 2016-12-18 MED ORDER — CEPHALEXIN 500 MG PO CAPS: 500.0000 mg | ORAL_CAPSULE | Freq: Two times a day (BID) | ORAL | 0 refills | Status: AC

## 2016-12-18 MED ORDER — VALSARTAN-HYDROCHLOROTHIAZIDE 320-12.5 MG PO TABS: 1.0000 | ORAL_TABLET | Freq: Every day | ORAL | 0 refills | Status: AC

## 2016-12-18 NOTE — Discharge Summary (Signed)
Physician Discharge Summary  Julie [name] ZOX:[id number] DOB: 04-18-1933 DOA: 12/16/2016  PCP: Willow Ora, MD  Admit date: 12/16/2016 Discharge date: 12/18/2016  Admitted From: Home  Disposition:  Home   Recommendations for Outpatient Follow-up:  1. Follow up with PCP in 1 weeks 2. Please obtain BMP/CBC in one week 3. Please follow up on the following pending results: FINAL URINE CULTURE RESULTS  Home Health:YES  Discharge Condition: STABLE  CODE STATUS: FULL   Brief/Interim Summary: HPI: Julie [name] is a 83 y.o. female with medical history significant for hypothyroidism, dementia, and hypertension, now presenting to the emergency department for several days of confusion. Patient is accompanied by her significant other who assists with the history. Patient had reportedly been in her usual state of health until approximately 4 days ago when she began to experience a nonspecific malaise and confusion. Patient describes having increased difficulty with memory over the past few days. Her significant other has appreciated the confusion and reports that she was saying some bizarre things this morning and did not remember it later. They decided to check her blood pressure and found it to be greater than 200/100. They called the PCP for advice and were directed to the ED for further evaluation. Patient denies any recent fall or trauma and denies any headache, change in vision or hearing, or focal numbness or weakness. She denies recent fevers or chills and denies cough or dyspnea, chest pain, or palpitations. She has never experienced similar symptoms previously.   ED Course: Upon arrival to the ED, patient is found to be afebrile, saturating well on room air, hypertensive to 206/130, and with vitals otherwise stable. EKG features a sinus bradycardia with rate 57 and chest x-ray is negative for acute cardiopulmonary disease. Chemistry panel reveals a potassium of 2.9 and elevated BUN to  creatinine ratio. CBC is unremarkable. Noncontrast head CT is notable for small lacunar infarcts along the anterior inferior capsules bilaterally, and the left basal ganglia. Neurology was consulted by the ED physician and advised for a medical admission to [facility], and advises lowering her blood pressure with a goal systolic of 180. Patient was placed on nicardipine infusion, remained stable in the ED, and will be admitted to the stepdown unit [facility] for ongoing evaluation and management of confusion and hypertensive urgency, suspected secondary to a new lacunar infarcts.    1. Encephalopathy/delirium in the setting of dementia. Likely brought on by UTI, placed on Rocephin, follow urine cultures, minimize narcotics and benzodiazepines, continue Namenda for baseline dementia. PT eval advance her Activity, may require placement. Check baseline B-12 and RPR which were WNL, TSH was stable.  Discharge home on 5 days keflex. Follow up with PCP.   Pt tolerated ceftriaxone well in hospital.  Neuro consulted and recommended BP control and continue aspirin, ammonia was ok and UA positive for infection.   2. Dementia. Continue Namenda.  Pt has 24/7 care at home with boyfriend and son.    3. Hypertension. Poorly controlled, placed on Norvasc along with Coreg and as needed hydralazine. Monitored and BPs did improve prior to discharge.   See adjustments to meds below. Follow up with PCP.    4. History of hypothyroidism on Synthroid, stable TSH.  5. Hypokalemia. Repleted orally.    6.  UTI - staph epidermidis - discharge on 5 more days of cephalexin.   Diet : Diet Heart Room service appropriate? Yes; Fluid consistency: Thin    Family Communication  : Left message  for son on listed cell phone on 12/18/2078 @ 9 AM  Code Status :  Full  Disposition Plan  :  Home with HH services  Consults  :  Neuro  Procedures  :    MRI-A Brain, CT Head - Non acute  DVT Prophylaxis  :  Lovenox     Discharge Diagnoses:  Principal Problem:   Lacunar stroke (HCC) Active Problems:   GERD (gastroesophageal reflux disease)   Dementia   Essential hypertension   Hypothyroidism   Hypokalemia   UTI (urinary tract infection)  Discharge Instructions  Discharge Instructions    Increase activity slowly    Complete by:  As directed      Allergies as of 12/18/2016      Reactions   Penicillins Cross Reactors Rash   Sulfa Drugs Cross Reactors Rash      Medication List    STOP taking these medications   omeprazole 40 MG capsule Commonly known as:  PRILOSEC   valsartan-hydrochlorothiazide 320-25 MG tablet Commonly known as:  DIOVAN-HCT Replaced by:  valsartan-hydrochlorothiazide 320-12.5 MG tablet     TAKE these medications   amLODipine 10 MG tablet Commonly known as:  NORVASC Take 1 tablet (10 mg total) by mouth daily with breakfast. What changed:  medication strength   aspirin 325 MG EC tablet Take 1 tablet (325 mg total) by mouth daily.   Calcium Carbonate-Vitamin D 600-400 MG-UNIT tablet Take 1 tablet by mouth daily.   carvedilol 12.5 MG tablet Commonly known as:  COREG Take 1 tablet (12.5 mg total) by mouth 2 (two) times daily with a meal.   cephALEXin 500 MG capsule Commonly known as:  KEFLEX Take 1 capsule (500 mg total) by mouth 2 (two) times daily.   cholecalciferol 1000 units tablet Commonly known as:  VITAMIN D Take 2,000 Units by mouth daily.   levothyroxine 50 MCG tablet Commonly known as:  SYNTHROID, LEVOTHROID Take 50 mcg by mouth daily with breakfast.   memantine 10 MG tablet Commonly known as:  NAMENDA Take 10 mg by mouth 2 (two) times daily.   PROLIA 60 MG/ML Soln injection Generic drug:  denosumab Inject 60 mg into the skin every 6 (six) months.   valsartan-hydrochlorothiazide 320-12.5 MG tablet Commonly known as:  DIOVAN HCT Take 1 tablet by mouth daily. Replaces:  valsartan-hydrochlorothiazide 320-25 MG tablet             Durable Medical Equipment        Start     Ordered   12/18/16 0740  For home use only DME Walker rolling  Once    Question:  Patient needs a walker to treat with the following condition  Answer:  Gait instability   12/18/16 0740     Follow-up Information    Willow Ora, MD. Schedule an appointment as soon as possible for a visit in 1 week(s).   Specialty:  Family Medicine Contact information: [address] [place] [address] [phone]          Allergies  Allergen Reactions  . Penicillins Cross Reactors Rash  . Sulfa Drugs Cross Reactors Rash    Procedures/Studies: Dg Chest 2 View  Result Date: 12/16/2016 CLINICAL DATA:  Hypertension. EXAM: CHEST  2 VIEW COMPARISON:  11/25/2011 FINDINGS: Cardiomediastinal silhouette is normal. Mediastinal contours appear intact. There is no evidence of focal airspace consolidation, pleural effusion or pneumothorax. Osseous structures are without acute abnormality. Soft tissues are grossly normal. IMPRESSION: No active cardiopulmonary disease. Electronically  Signed   By: Ted Mcalpineobrinka  Dimitrova M.D.   On: 12/16/2016 19:41   Ct Head Wo Contrast  Result Date: 12/16/2016 CLINICAL DATA:  Hypertension, altered mental status EXAM: CT HEAD WITHOUT CONTRAST TECHNIQUE: Contiguous axial images were obtained from the base of the skull through the vertex without intravenous contrast. COMPARISON:  None. FINDINGS: Brain: Bold mild central atrophy with minimal small vessel ischemic disease of periventricular white matter. Small lacunar infarcts along the anterior internal capsules bilaterally and left basal ganglia. No large vascular territory infarction. No intra-axial mass nor extra-axial fluid collection. No edema or midline shift. Vascular: Atherosclerosis of the cavernous carotid arteries and both vertebral arteries. No hyperdense vessels. Skull: No skull fracture nor primary bone lesions. Sinuses/Orbits: Clear paranasal sinuses and  mastoids. Symmetric appearance of the orbits and globes. No acute retrobulbar abnormality. Other: None IMPRESSION: 1. Chronic appearing small vessel ischemic disease with mild central atrophy. 2. Small lacunar infarcts along the anterior inferior capsules bilaterally and left basal ganglia. 3. No acute intracranial abnormality. Electronically Signed   By: Tollie Ethavid  Kwon M.D.   On: 12/16/2016 17:55   Mr Brain Wo Contrast  Result Date: 12/16/2016 CLINICAL DATA:  Initial evaluation for acute stroke. EXAM: MRI HEAD WITHOUT CONTRAST MRA HEAD WITHOUT CONTRAST TECHNIQUE: Multiplanar, multiecho pulse sequences of the brain and surrounding structures were obtained without intravenous contrast. Angiographic images of the head were obtained using MRA technique without contrast. COMPARISON:  Prior CT from earlier same day. FINDINGS: MRI HEAD FINDINGS Brain: Study moderately limited by motion artifact and patient's inability to tolerate the full length of the exam. Additionally, susceptibility artifact at the left occipital scalp also limits evaluation. Diffuse prominence of the CSF containing spaces compatible with generalized cerebral atrophy. Patchy T2/FLAIR hyperintensity within the periventricular and deep white matter both cerebral hemispheres most consistent with chronic small vessel skin disease, mild for age. Chronic microvascular ischemic changes present within the pons. No abnormal foci of restricted diffusion to suggest acute or subacute ischemia. Gray-white matter differentiation maintained. No evidence for acute or chronic intracranial hemorrhage. No areas of chronic infarction identified. No mass lesion, midline shift or mass effect. Ventricles normal size without evidence for hydrocephalus. No extra-axial fluid collection. Major dural sinuses are grossly patent. Pituitary gland suprasellar region within normal limits. Midline structures intact and normal. Vascular: Major intracranial vascular flow voids are  grossly maintained. Skull and upper cervical spine: Craniocervical junction within normal limits. Multilevel degenerative spondylolysis noted within the visualized upper cervical spine without significant stenosis. Bone marrow signal intensity within normal limits. Hyperostosis frontalis interna noted. No scalp soft tissue abnormality. Sinuses/Orbits: Globes and orbital soft tissues within normal limits. Paranasal sinuses are clear. No mastoid effusion. Inner ear structures normal. Other: None. MRA HEAD FINDINGS ANTERIOR CIRCULATION: Study moderately degraded by motion artifact. Distal cervical segments of the internal carotid arteries are patent with antegrade flow. Petrous, cavernous, and supraclinoid segments patent without flow-limiting stenosis. ICA termini patent. Right A1 segment dominant and widely patent. Hypoplastic left A1 segment noted, which likely accounts for the slightly diminutive left ICA is compared to the right. Anterior communicating artery grossly normal. Anterior cerebral arteries patent to their distal aspects without stenosis or occlusion. M1 segments patent without stenosis or occlusion. M1 segments are somewhat truncated bilaterally. No proximal M2 occlusion. Distal MCA branches well opacified and symmetric. POSTERIOR CIRCULATION: Vertebral arteries not well evaluated on this exam, but grossly patent to the vertebrobasilar junction. Basilar artery patent without stenosis. Superior cerebral arteries patent proximally. Both of the  PCA is primarily supplied via the basilar artery and are grossly patent to their distal aspects. Small right posterior communicating artery noted. No obvious aneurysm or vascular malformation. IMPRESSION: MRI HEAD IMPRESSION: 1. Moderately motion degraded study. No acute intracranial infarct or other process identified. 2. Mild age-related cerebral atrophy with chronic small vessel ischemic disease. MRA HEAD IMPRESSION: 1. Moderately motion degraded study. 2.  Grossly negative intracranial MRA. No large vessel occlusion. No high-grade or correctable stenosis. Electronically Signed   By: Rise Mu M.D.   On: 12/16/2016 22:02   Mr Maxine Glenn Head/brain VW Cm  Result Date: 12/16/2016 CLINICAL DATA:  Initial evaluation for acute stroke. EXAM: MRI HEAD WITHOUT CONTRAST MRA HEAD WITHOUT CONTRAST TECHNIQUE: Multiplanar, multiecho pulse sequences of the brain and surrounding structures were obtained without intravenous contrast. Angiographic images of the head were obtained using MRA technique without contrast. COMPARISON:  Prior CT from earlier same day. FINDINGS: MRI HEAD FINDINGS Brain: Study moderately limited by motion artifact and patient's inability to tolerate the full length of the exam. Additionally, susceptibility artifact at the left occipital scalp also limits evaluation. Diffuse prominence of the CSF containing spaces compatible with generalized cerebral atrophy. Patchy T2/FLAIR hyperintensity within the periventricular and deep white matter both cerebral hemispheres most consistent with chronic small vessel skin disease, mild for age. Chronic microvascular ischemic changes present within the pons. No abnormal foci of restricted diffusion to suggest acute or subacute ischemia. Gray-white matter differentiation maintained. No evidence for acute or chronic intracranial hemorrhage. No areas of chronic infarction identified. No mass lesion, midline shift or mass effect. Ventricles normal size without evidence for hydrocephalus. No extra-axial fluid collection. Major dural sinuses are grossly patent. Pituitary gland suprasellar region within normal limits. Midline structures intact and normal. Vascular: Major intracranial vascular flow voids are grossly maintained. Skull and upper cervical spine: Craniocervical junction within normal limits. Multilevel degenerative spondylolysis noted within the visualized upper cervical spine without significant stenosis. Bone  marrow signal intensity within normal limits. Hyperostosis frontalis interna noted. No scalp soft tissue abnormality. Sinuses/Orbits: Globes and orbital soft tissues within normal limits. Paranasal sinuses are clear. No mastoid effusion. Inner ear structures normal. Other: None. MRA HEAD FINDINGS ANTERIOR CIRCULATION: Study moderately degraded by motion artifact. Distal cervical segments of the internal carotid arteries are patent with antegrade flow. Petrous, cavernous, and supraclinoid segments patent without flow-limiting stenosis. ICA termini patent. Right A1 segment dominant and widely patent. Hypoplastic left A1 segment noted, which likely accounts for the slightly diminutive left ICA is compared to the right. Anterior communicating artery grossly normal. Anterior cerebral arteries patent to their distal aspects without stenosis or occlusion. M1 segments patent without stenosis or occlusion. M1 segments are somewhat truncated bilaterally. No proximal M2 occlusion. Distal MCA branches well opacified and symmetric. POSTERIOR CIRCULATION: Vertebral arteries not well evaluated on this exam, but grossly patent to the vertebrobasilar junction. Basilar artery patent without stenosis. Superior cerebral arteries patent proximally. Both of the PCA is primarily supplied via the basilar artery and are grossly patent to their distal aspects. Small right posterior communicating artery noted. No obvious aneurysm or vascular malformation. IMPRESSION: MRI HEAD IMPRESSION: 1. Moderately motion degraded study. No acute intracranial infarct or other process identified. 2. Mild age-related cerebral atrophy with chronic small vessel ischemic disease. MRA HEAD IMPRESSION: 1. Moderately motion degraded study. 2. Grossly negative intracranial MRA. No large vessel occlusion. No high-grade or correctable stenosis. Electronically Signed   By: Rise Mu M.D.   On: 12/16/2016 22:02    (  Echo, Carotid, EGD, Colonoscopy, ERCP)     Subjective: Pt reports that she has felt really good today and no problem going home has boyfriend and son for 24/7 support.    Discharge Exam: Vitals:   12/18/16 1216 12/18/16 1328  BP: (!) 160/81 122/67  Pulse:    Resp: (!) 24 13  Temp:     Vitals:   12/18/16 1100 12/18/16 1144 12/18/16 1216 12/18/16 1328  BP: (!) 176/79 (!) 176/79 (!) 160/81 122/67  Pulse: 76     Resp: 15 19 (!) 24 13  Temp: 97.8 F (36.6 C)     TempSrc: Oral     SpO2:      Weight:      Height:        General: Pt is alert, awake, not in acute distress Cardiovascular: RRR, S1/S2 +, no rubs, no gallops Respiratory: CTA bilaterally, no wheezing, no rhonchi Abdominal: Soft, NT, ND, bowel sounds + Extremities: no edema, no cyanosis Neuro: nonfocal   The results of significant diagnostics from this hospitalization (including imaging, microbiology, ancillary and laboratory) are listed below for reference.     Microbiology: Recent Results (from the past 240 hour(s))  MRSA PCR Screening     Status: None   Collection Time: 12/17/16 12:27 AM  Result Value Ref Range Status   MRSA by PCR NEGATIVE NEGATIVE Final    Comment:        The GeneXpert MRSA Assay (FDA approved for NASAL specimens only), is one component of a comprehensive MRSA colonization surveillance program. It is not intended to diagnose MRSA infection nor to guide or monitor treatment for MRSA infections.   Culture, Urine     Status: Abnormal (Preliminary result)   Collection Time: 12/17/16  6:43 AM  Result Value Ref Range Status   Specimen Description URINE, RANDOM  Final   Special Requests NONE  Final   Culture >=100,000 COLONIES/mL STAPHYLOCOCCUS EPIDERMIDIS (A)  Final   Report Status PENDING  Incomplete     Labs: BNP (last 3 results) No results for input(s): BNP in the last 8760 hours. Basic Metabolic Panel:  Recent Labs Lab 12/16/16 1719 12/16/16 1747 12/18/16 0231  NA 141 141 141  K 3.0* 2.9* 3.5  CL 104 104 108   CO2 26  --  23  GLUCOSE 100* 97 102*  BUN 20 21* 11  CREATININE 0.65 0.70 0.61  CALCIUM 9.2  --  8.5*  MG  --   --  2.4   Liver Function Tests:  Recent Labs Lab 12/16/16 1719  AST 48*  ALT 18  ALKPHOS 39  BILITOT 0.5  PROT 7.1  ALBUMIN 4.5   No results for input(s): LIPASE, AMYLASE in the last 168 hours.  Recent Labs Lab 12/17/16 0306  AMMONIA 36*   CBC:  Recent Labs Lab 12/16/16 1719 12/16/16 1747 12/18/16 0231  WBC 8.1  --  9.2  NEUTROABS 5.2  --   --   HGB 14.7 13.9 14.9  HCT 41.5 41.0 42.6  MCV 90.4  --  91.4  PLT 192  --  197   Cardiac Enzymes: No results for input(s): CKTOTAL, CKMB, CKMBINDEX, TROPONINI in the last 168 hours. BNP: Invalid input(s): POCBNP CBG:  Recent Labs Lab 12/16/16 1736  GLUCAP 97   D-Dimer No results for input(s): DDIMER in the last 72 hours. Hgb A1c No results for input(s): HGBA1C in the last 72 hours. Lipid Profile No results for input(s): CHOL, HDL, LDLCALC, TRIG, CHOLHDL, LDLDIRECT in the  last 72 hours. Thyroid function studies  Recent Labs  12/17/16 0306  TSH 1.633   Anemia work up  Recent Labs  12/17/16 0816  VITAMINB12 559   Urinalysis    Component Value Date/Time   COLORURINE YELLOW 12/17/2016 0643   APPEARANCEUR CLEAR 12/17/2016 0643   LABSPEC 1.009 12/17/2016 0643   PHURINE 6.0 12/17/2016 0643   GLUCOSEU NEGATIVE 12/17/2016 0643   HGBUR MODERATE (A) 12/17/2016 0643   BILIRUBINUR NEGATIVE 12/17/2016 0643   KETONESUR NEGATIVE 12/17/2016 0643   PROTEINUR NEGATIVE 12/17/2016 0643   UROBILINOGEN 0.2 03/31/2012 1048   NITRITE POSITIVE (A) 12/17/2016 0643   LEUKOCYTESUR TRACE (A) 12/17/2016 0643   Sepsis Labs Invalid input(s): PROCALCITONIN,  WBC,  LACTICIDVEN Microbiology Recent Results (from the past 240 hour(s))  MRSA PCR Screening     Status: None   Collection Time: 12/17/16 12:27 AM  Result Value Ref Range Status   MRSA by PCR NEGATIVE NEGATIVE Final    Comment:        The GeneXpert  MRSA Assay (FDA approved for NASAL specimens only), is one component of a comprehensive MRSA colonization surveillance program. It is not intended to diagnose MRSA infection nor to guide or monitor treatment for MRSA infections.   Culture, Urine     Status: Abnormal (Preliminary result)   Collection Time: 12/17/16  6:43 AM  Result Value Ref Range Status   Specimen Description URINE, RANDOM  Final   Special Requests NONE  Final   Culture >=100,000 COLONIES/mL STAPHYLOCOCCUS EPIDERMIDIS (A)  Final   Report Status PENDING  Incomplete    Time coordinating discharge: 33 minutes  SIGNED: Standley Dakins, MD  [facility] 12/18/2016, 1:56 PM Pager [phone]  If 7PM-7AM, please contact night-coverage [email or web address] Password [email or web address]

## 2016-12-18 NOTE — Evaluation (Signed)
Occupational Therapy Evaluation Patient Details Name: Julie [name] MRN: [id number] DOB: 1933-03-18 Today's Date: 12/18/2016    History of Present Illness Julie [name] is a 83 y.o. female with medical history significant for hypothyroidism, dementia, and hypertension, admitted due to several days of confusion and elevated blood pressure.  MRI negative for acute infarct but noted chronic bilateral lacunar infarcts.  Work up also positive for UTI.   Clinical Impression   Pt admitted with above, and demonstrates the below listed deficits.  She requires supervision for ADLs due to cognitive impairments.  Per family discussion with SLP, she was having progressive difficulty at home due to dementia.  Recommend 24 hour supervision, that pt not drive, HHOT.  Feel pt is likely most appropriate for eventual transition to ALF.  All further OT needs can be addressed by HHOT.  OT will sign off .     Follow Up Recommendations  Home health OT;Supervision/Assistance - 24 hour    Equipment Recommendations  None recommended by OT    Recommendations for Other Services       Precautions / Restrictions Precautions Precautions: Fall Restrictions Weight Bearing Restrictions: No      Mobility Bed Mobility Overal bed mobility: Independent                Transfers Overall transfer level: Independent Equipment used: None                  Balance Overall balance assessment: Needs assistance Sitting-balance support: Feet supported Sitting balance-Leahy Scale: Good     Standing balance support: No upper extremity supported Standing balance-Leahy Scale: Good                             ADL either performed or assessed with clinical judgement   ADL Overall ADL's : Needs assistance/impaired Eating/Feeding: Independent   Grooming: Wash/dry hands;Wash/dry face;Oral care;Brushing hair;Supervision/safety;Standing   Upper Body Bathing: Set up;Sitting   Lower Body  Bathing: Supervison/ safety;Sit to/from stand   Upper Body Dressing : Set up;Sitting   Lower Body Dressing: Supervision/safety;Sit to/from stand   Toilet Transfer: Supervision/safety;Ambulation;Comfort height toilet;Grab bars   Toileting- Clothing Manipulation and Hygiene: Supervision/safety;Sit to/from stand   Tub/ Shower Transfer: Walk-in shower;Supervision/safety;Ambulation   Functional mobility during ADLs: Supervision/safety General ADL Comments: supervision to ensure thoroughness and safety      Vision Baseline Vision/History: Wears glasses Wears Glasses: At all times Patient Visual Report: No change from baseline Vision Assessment?: No apparent visual deficits     Perception Perception Perception Tested?: Yes   Praxis Praxis Praxis tested?: Within functional limits    Pertinent Vitals/Pain Pain Assessment: No/denies pain     Hand Dominance Right   Extremity/Trunk Assessment Upper Extremity Assessment Upper Extremity Assessment: Overall WFL for tasks assessed   Lower Extremity Assessment Lower Extremity Assessment: Defer to PT evaluation   Cervical / Trunk Assessment Cervical / Trunk Assessment: Normal   Communication Communication Communication: No difficulties   Cognition Arousal/Alertness: Awake/alert Behavior During Therapy: WFL for tasks assessed/performed Overall Cognitive Status: Impaired/Different from baseline Area of Impairment: Orientation;Safety/judgement;Memory;Attention                 Orientation Level: Disoriented to;Time;Situation Current Attention Level: Selective Memory: Decreased short-term memory   Safety/Judgement: Decreased awareness of deficits     General Comments: Pt unsure of the year.  She is unable to tell therapist what medications she takes.  She requires min cues for problem  solving in unfamiliar environment    General Comments  VSS     Exercises     Shoulder Instructions      Home Living Family/patient  expects to be discharged to:: Private residence Living Arrangements: Alone Available Help at Discharge: Friend(s) Type of Home: House Home Access: Stairs to enter Secretary/administrator of Steps: 3 Entrance Stairs-Rails: Right Home Layout: One level     Bathroom Shower/Tub: Producer, television/film/video: Standard     Home Equipment: None   Additional Comments: Family reported to SLP that pt was living independently, but was having progressive difficulty at home   Lives With: Alone    Prior Functioning/Environment Level of Independence: Independent        Comments: per family to SLP, pt was having progressive difficulty due to dementia         OT Problem List: Decreased cognition;Decreased safety awareness      OT Treatment/Interventions:      OT Goals(Current goals can be found in the care plan section) Acute Rehab OT Goals Patient Stated Goal: To return home OT Goal Formulation: With patient Time For Goal Achievement: 12/25/16 Potential to Achieve Goals: Good  OT Frequency:     Barriers to D/C:            Co-evaluation              AM-PAC PT "6 Clicks" Daily Activity     Outcome Measure Help from another person eating meals?: None Help from another person taking care of personal grooming?: None Help from another person toileting, which includes using toliet, bedpan, or urinal?: None Help from another person bathing (including washing, rinsing, drying)?: A Little Help from another person to put on and taking off regular upper body clothing?: A Little Help from another person to put on and taking off regular lower body clothing?: A Little 6 Click Score: 21   End of Session Nurse Communication: Mobility status  Activity Tolerance: Patient tolerated treatment well Patient left: in bed;with call bell/phone within reach;with bed alarm set  OT Visit Diagnosis: Cognitive communication deficit (R41.841)                Time: 1610-9604 OT Time  Calculation (min): 28 min Charges:  OT General Charges $OT Visit: 1 Procedure OT Evaluation $OT Eval Low Complexity: 1 Procedure OT Treatments $Self Care/Home Management : 8-22 mins G-Codes:     [employer], OTR/L [phone]    Pooja Camuso M 12/18/2016, 2:41 PM

## 2016-12-18 NOTE — Care Management Note (Signed)
Case Management Note  Patient Details  Name: Julie [name] MRN: [id number] Date of Birth: 11/28/1932  Subjective/Objective:   From home alone, presents with encephalopahthy/delirium with dementia, uti, htn, hypokalemia. Per pt eval rec HHPT with 24 hr care.  Patient has a boyfriend who comes over frequently and [place]  [address], [place] [id number], home phone is [phone].  She has [employer] insurance. NCM spoke with boyfriend Lyda JesterCurtis, he states he will be with her for 24 hrs per day and he she will be staying with him. NCM [facility].  [facility], [facility] [place] [facility]), PT, OT, aide and Social Worker and rolling walker.  Soc will begin 24-48 hrs post dc.  Patient is for dc today.   PCP Asencion Partridgeamille Andy                             Action/Plan: NCM will follow for dc needs.  Expected Discharge Date:  12/18/16               Expected Discharge Plan:  Home w Home Health Services  In-House Referral:     Discharge planning Services  CM Consult  Post Acute Care Choice:  Durable Medical Equipment, Home Health Choice offered to:  Patient  DME Arranged:  Walker rolling DME Agency:  [facility]  HH Arranged:  RN, PT, OT, Nurse's Aide, Social Work [facility]  [facility]  Status of Service:  Completed, signed off  If discussed at [employer] of [employer] Meetings, dates discussed:    Additional Comments:  Leone Havenaylor, Krystal Delduca Clinton, RN 12/18/2016, 3:08 PM

## 2016-12-18 NOTE — Progress Notes (Signed)
Discharge Note. Reviewed discharge instructions with patient and patient's boyfriend at the bedside. Patient did not seem to know what medications she takes but the boyfriend seemed knowledgeable about patient's medications and asked appropriate questions. Reviewed all medications and emphasized the changes that were made in dosage. Patient's boyfriend said they would go directly to the pharmacy to pick up Rxs before heading home. Patient's boyfriend stated patient will be staying with him and that he will take care of her. All questions were answered. Patient ready for discharge.

## 2016-12-18 NOTE — Progress Notes (Signed)
Physical Therapy Treatment Patient Details Name: Julie [name] MRN: [id number] DOB: 28-Jul-1932 Today's Date: 12/18/2016    History of Present Illness KEHINDE TOTZKE is a 83 y.o. female with medical history significant for hypothyroidism, dementia, and hypertension, admitted due to several days of confusion and elevated blood pressure.  MRI negative for acute infarct but noted chronic bilateral lacunar infarcts.  Work up also positive for UTI.    PT Comments    Patient with elevated BP post mobility. RN aware. Pt required supervision for safety throughout session. Pt will need 24 hour supervision upon d/c and will be going to boyfriend's house. Current plan remains appropriate.   Follow Up Recommendations  Supervision/Assistance - 24 hour;Home health PT     Equipment Recommendations  Rolling walker with 5" wheels    Recommendations for Other Services       Precautions / Restrictions Precautions Precautions: Fall Precaution Comments: watch BP Restrictions Weight Bearing Restrictions: No    Mobility  Bed Mobility Overal bed mobility: Independent                Transfers Overall transfer level: Independent Equipment used: None                Ambulation/Gait Ambulation/Gait assistance: Supervision;Min guard Ambulation Distance (Feet): 250 Feet Assistive device: None Gait Pattern/deviations: Step-through pattern;Drifts right/left     General Gait Details: cues for cadence and safety as pt seemed unaware of lines; pt with varying gait speeds   Stairs Stairs: Yes   Stair Management: One rail Right;Step to pattern;Forwards Number of Stairs: 10 General stair comments: cues for safety; pt impulsive and asked to stop ascended steps however went to the top  Wheelchair Mobility    Modified Rankin (Stroke Patients Only)       Balance Overall balance assessment: Needs assistance Sitting-balance support: Feet supported Sitting balance-Leahy Scale:  Good     Standing balance support: No upper extremity supported Standing balance-Leahy Scale: Good                              Cognition Arousal/Alertness: Awake/alert Behavior During Therapy: WFL for tasks assessed/performed;Impulsive Overall Cognitive Status: Impaired/Different from baseline Area of Impairment: Orientation;Safety/judgement;Memory;Attention                 Orientation Level: Disoriented to;Time;Situation Current Attention Level: Selective Memory: Decreased short-term memory   Safety/Judgement: Decreased awareness of deficits     General Comments: pt reported she was "just about to get up and go to the bathroom" upon arrival and reported she did not know to call for assistance; pt unable to state if she ate lunch or why she was admitted to hospital      Exercises      General Comments General comments (skin integrity, edema, etc.): BP elevated to 160s/117 post ambulation--RN aware and had already given BP meds      Pertinent Vitals/Pain Pain Assessment: No/denies pain    Home Living Family/patient expects to be discharged to:: Private residence Living Arrangements: Alone Available Help at Discharge: Friend(s) Type of Home: House Home Access: Stairs to enter Entrance Stairs-Rails: Right Home Layout: One level Home Equipment: None Additional Comments: Family reported to SLP that pt was living independently, but was having progressive difficulty at home     Prior Function Level of Independence: Independent      Comments: per family to SLP, pt was having progressive difficulty due to dementia  PT Goals (current goals can now be found in the care plan section) Acute Rehab PT Goals Patient Stated Goal: To return home Progress towards PT goals: Progressing toward goals    Frequency    Min 4X/week      PT Plan Current plan remains appropriate    Co-evaluation              AM-PAC PT "6 Clicks" Daily Activity   Outcome Measure  Difficulty turning over in bed (including adjusting bedclothes, sheets and blankets)?: None Difficulty moving from lying on back to sitting on the side of the bed? : None Difficulty sitting down on and standing up from a chair with arms (e.g., wheelchair, bedside commode, etc,.)?: A Little Help needed moving to and from a bed to chair (including a wheelchair)?: A Little Help needed walking in hospital room?: A Little Help needed climbing 3-5 steps with a railing? : A Little 6 Click Score: 20    End of Session Equipment Utilized During Treatment: Gait belt Activity Tolerance: Patient tolerated treatment well Patient left: in chair;with call bell/phone within reach;with chair alarm set Nurse Communication: Mobility status;Other (comment) (BP) PT Visit Diagnosis: Other abnormalities of gait and mobility (R26.89)     Time: [id number] PT Time Calculation (min) (ACUTE ONLY): 23 min  Charges:  $Gait Training: 8-22 mins $Therapeutic Activity: 8-22 mins                    G Codes:       Erline LevineKellyn Azaleah Usman, PTA Pager: [phone]     Carolynne EdouardKellyn R Jenne Sellinger 12/18/2016, 4:38 PM

## 2016-12-19 LAB — URINE CULTURE: Culture: >=100000 — AB

## 2018-11-14 ENCOUNTER — Other Ambulatory Visit: Payer: Self-pay

## 2018-11-14 ENCOUNTER — Encounter (HOSPITAL_COMMUNITY): Payer: Self-pay

## 2018-11-14 ENCOUNTER — Emergency Department (HOSPITAL_COMMUNITY)
Admission: EM | Admit: 2018-11-14 | Discharge: 2018-11-18 | Disposition: A | Discharge status: Home / Self Care | Payer: Medicare HMO | Attending: Emergency Medicine | Admitting: Emergency Medicine

## 2018-11-14 DIAGNOSIS — E876 Hypokalemia: Secondary | ICD-10-CM | POA: Diagnosis not present

## 2018-11-14 DIAGNOSIS — R451 Restlessness and agitation: Secondary | ICD-10-CM | POA: Diagnosis present

## 2018-11-14 DIAGNOSIS — Z1159 Encounter for screening for other viral diseases: Secondary | ICD-10-CM | POA: Diagnosis not present

## 2018-11-14 DIAGNOSIS — F0391 Unspecified dementia with behavioral disturbance: Secondary | ICD-10-CM | POA: Diagnosis not present

## 2018-11-14 DIAGNOSIS — Z79899 Other long term (current) drug therapy: Secondary | ICD-10-CM | POA: Insufficient documentation

## 2018-11-14 DIAGNOSIS — Z96643 Presence of artificial hip joint, bilateral: Secondary | ICD-10-CM | POA: Diagnosis not present

## 2018-11-14 DIAGNOSIS — E039 Hypothyroidism, unspecified: Secondary | ICD-10-CM | POA: Insufficient documentation

## 2018-11-14 DIAGNOSIS — F419 Anxiety disorder, unspecified: Secondary | ICD-10-CM | POA: Insufficient documentation

## 2018-11-14 DIAGNOSIS — I10 Essential (primary) hypertension: Secondary | ICD-10-CM | POA: Insufficient documentation

## 2018-11-14 DIAGNOSIS — F329 Major depressive disorder, single episode, unspecified: Secondary | ICD-10-CM | POA: Diagnosis not present

## 2018-11-14 DIAGNOSIS — F0390 Unspecified dementia without behavioral disturbance: Secondary | ICD-10-CM | POA: Diagnosis present

## 2018-11-14 DIAGNOSIS — F0151 Vascular dementia with behavioral disturbance: Secondary | ICD-10-CM | POA: Diagnosis not present

## 2018-11-14 LAB — RAPID URINE DRUG SCREEN, HOSP PERFORMED
Amphetamines: NOT DETECTED
Barbiturates: NOT DETECTED
Benzodiazepines: POSITIVE — AB
Cocaine: NOT DETECTED
Opiates: NOT DETECTED
Tetrahydrocannabinol: NOT DETECTED

## 2018-11-14 LAB — CBC WITH DIFFERENTIAL/PLATELET
Abs Immature Granulocytes: 0.02 10*3/uL (ref 0.00–0.07)
Basophils Absolute: 0 10*3/uL (ref 0.0–0.1)
Basophils Relative: 0 %
Eosinophils Absolute: 0.1 10*3/uL (ref 0.0–0.5)
Eosinophils Relative: 1 %
HCT: 44.2 % (ref 36.0–46.0)
Hemoglobin: 15.1 g/dL — ABNORMAL HIGH (ref 12.0–15.0)
Immature Granulocytes: 0 %
Lymphocytes Relative: 18 %
Lymphs Abs: 1.7 10*3/uL (ref 0.7–4.0)
MCH: 31.7 pg (ref 26.0–34.0)
MCHC: 34.2 g/dL (ref 30.0–36.0)
MCV: 92.7 fL (ref 80.0–100.0)
Monocytes Absolute: 1 10*3/uL (ref 0.1–1.0)
Monocytes Relative: 11 %
Neutro Abs: 6.7 10*3/uL (ref 1.7–7.7)
Neutrophils Relative %: 70 %
Platelets: 226 10*3/uL (ref 150–400)
RBC: 4.77 MIL/uL (ref 3.87–5.11)
RDW: 13.2 % (ref 11.5–15.5)
WBC: 9.5 10*3/uL (ref 4.0–10.5)
nRBC: 0 % (ref 0.0–0.2)

## 2018-11-14 LAB — COMPREHENSIVE METABOLIC PANEL
ALT: 19 U/L (ref 0–44)
AST: 42 U/L — ABNORMAL HIGH (ref 15–41)
Albumin: 4.6 g/dL (ref 3.5–5.0)
Alkaline Phosphatase: 82 U/L (ref 38–126)
Anion gap: 11 (ref 5–15)
BUN: 18 mg/dL (ref 8–23)
CO2: 29 mmol/L (ref 22–32)
Calcium: 9.3 mg/dL (ref 8.9–10.3)
Chloride: 99 mmol/L (ref 98–111)
Creatinine, Ser: 0.7 mg/dL (ref 0.44–1.00)
GFR calc Af Amer: >60 mL/min (ref >60)
GFR calc non Af Amer: >60 mL/min (ref >60)
Glucose, Bld: 123 mg/dL — ABNORMAL HIGH (ref 70–99)
Potassium: 2.8 mmol/L — ABNORMAL LOW (ref 3.5–5.1)
Sodium: 139 mmol/L (ref 135–145)
Total Bilirubin: 0.5 mg/dL (ref 0.3–1.2)
Total Protein: 7.6 g/dL (ref 6.5–8.1)

## 2018-11-14 LAB — URINALYSIS, ROUTINE W REFLEX MICROSCOPIC
Bacteria, UA: NONE SEEN
Bilirubin Urine: NEGATIVE
Glucose, UA: NEGATIVE mg/dL
Ketones, ur: NEGATIVE mg/dL
Nitrite: NEGATIVE
Protein, ur: NEGATIVE mg/dL
Specific Gravity, Urine: 1.016 (ref 1.005–1.030)
pH: 6 (ref 5.0–8.0)

## 2018-11-14 LAB — ETHANOL: Alcohol, Ethyl (B): <10 mg/dL (ref <10)

## 2018-11-14 LAB — MAGNESIUM: Magnesium: 2.3 mg/dL (ref 1.7–2.4)

## 2018-11-14 MED ORDER — HYDROCHLOROTHIAZIDE 12.5 MG PO CAPS
12.5000 mg | ORAL_CAPSULE | Freq: Every day | ORAL | Status: DC
  Administered 2018-11-15 – 2018-11-17 (×3): 12.5 mg via ORAL
  Filled 2018-11-14 (×4): qty 1

## 2018-11-14 MED ORDER — CARVEDILOL 12.5 MG PO TABS
12.5000 mg | ORAL_TABLET | Freq: Two times a day (BID) | ORAL | Status: DC
  Administered 2018-11-15 – 2018-11-18 (×7): 12.5 mg via ORAL
  Filled 2018-11-14 (×8): qty 1

## 2018-11-14 MED ORDER — DORZOLAMIDE HCL-TIMOLOL MAL 2-0.5 % OP SOLN
1.0000 [drp] | Freq: Two times a day (BID) | OPHTHALMIC | Status: DC
  Administered 2018-11-14 – 2018-11-17 (×7): 1 [drp] via OPHTHALMIC
  Filled 2018-11-14: qty 10

## 2018-11-14 MED ORDER — ONDANSETRON HCL 4 MG PO TABS: 4.0000 mg | ORAL_TABLET | Freq: Three times a day (TID) | ORAL | Status: DC | PRN

## 2018-11-14 MED ORDER — ALUM & MAG HYDROXIDE-SIMETH 200-200-20 MG/5ML PO SUSP: 30.0000 mL | Freq: Four times a day (QID) | ORAL | Status: DC | PRN

## 2018-11-14 MED ORDER — POTASSIUM CHLORIDE CRYS ER 20 MEQ PO TBCR
40.0000 meq | EXTENDED_RELEASE_TABLET | Freq: Every day | ORAL | Status: AC
  Administered 2018-11-14 – 2018-11-17 (×4): 40 meq via ORAL
  Filled 2018-11-14 (×4): qty 2

## 2018-11-14 MED ORDER — LEVOTHYROXINE SODIUM 50 MCG PO TABS
50.0000 ug | ORAL_TABLET | Freq: Every day | ORAL | Status: DC
  Administered 2018-11-15 – 2018-11-18 (×4): 50 ug via ORAL
  Filled 2018-11-14 (×4): qty 1

## 2018-11-14 MED ORDER — LORAZEPAM 0.5 MG PO TABS
0.2500 mg | ORAL_TABLET | Freq: Three times a day (TID) | ORAL | Status: DC | PRN
  Administered 2018-11-14 – 2018-11-18 (×4): 0.25 mg via ORAL
  Filled 2018-11-14 (×5): qty 1

## 2018-11-14 MED ORDER — ACETAMINOPHEN 325 MG PO TABS: 650.0000 mg | ORAL_TABLET | ORAL | Status: DC | PRN

## 2018-11-14 MED ORDER — IRBESARTAN 300 MG PO TABS
300.0000 mg | ORAL_TABLET | Freq: Every day | ORAL | Status: DC
  Administered 2018-11-15 – 2018-11-17 (×3): 300 mg via ORAL
  Filled 2018-11-14 (×4): qty 1

## 2018-11-14 MED ORDER — MEMANTINE HCL 10 MG PO TABS
10.0000 mg | ORAL_TABLET | Freq: Two times a day (BID) | ORAL | Status: DC
  Administered 2018-11-14 – 2018-11-17 (×7): 10 mg via ORAL
  Filled 2018-11-14 (×8): qty 1

## 2018-11-14 MED ORDER — TRAZODONE HCL 50 MG PO TABS
50.0000 mg | ORAL_TABLET | Freq: Every day | ORAL | Status: DC
  Administered 2018-11-14 – 2018-11-17 (×4): 50 mg via ORAL
  Filled 2018-11-14 (×4): qty 1

## 2018-11-14 NOTE — ED Notes (Signed)
Walked patient to bathroom.

## 2018-11-14 NOTE — ED Notes (Signed)
Bed: EQ68 Expected date:  Expected time:  Means of arrival:  Comments: AMS aggressive behavior
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
## 2018-11-14 NOTE — ED Notes (Signed)
Telepsych TTS at bedside. 

## 2018-11-14 NOTE — ED Triage Notes (Signed)
Her son, with whom she lives phoned EMS d/t concern about her apparently advancing dementia. He has been working with her pcp to get her placed in long-term care with no success thus far. He told EMS that pt. Had "packed all of her belongings and was attempting to get into her car by breaking into it with a screwdriver". She is oriented to her situation and her date of birth only. Her words are clear. She tells me it is "Tuesday". She ambulates with ease and is healthy-looking.

## 2018-11-14 NOTE — ED Notes (Signed)
Informed nurse of b/p

## 2018-11-14 NOTE — ED Provider Notes (Addendum)
[facility]-EMERGENCY DEPT Provider Note   CSN: [id number] Arrival date & time: 11/14/18  1742    History   Chief Complaint Chief Complaint  Patient presents with  . Agitation    HPI Julie [name] is a 85 y.o. female.     The history is provided by the patient and medical records. No language interpreter was used.   Julie [name] is a 85 y.o. female who presents to the Emergency Department complaining of agitation.  Level V caveat due to dementia. History is provided by EMS and the patient's son. Per reports she has a history of severe dementia and has been followed by her PCP for this. When the sun came home she refused to take her 3 PM Ativan and became increasingly agitated, trying to break into her car with a screwdriver. He has taken away her keys because she has been unable to drive. He has been attempting to get her placement with [facility] but has been unable to get this accomplished due to the current pandemic. He denies any recent illnesses. Denies fevers, cough, vomiting, dysuria. He does state that she is at times not compliant with her medications. There was a similar event in early April where the patient was agitated and shouting and refuse transfer to the emergency department at that time. Her son is currently driving to the magistrate to take out IVC papers because he does not feel like she is safe at home. Past Medical History:  Diagnosis Date  . Anxiety   . Arthritis    SEVERE OA LEFT HIP  - S/P RIGHT HIP REPLACEMENT / OCCASIONAL BACK AND NECK PAIN  . Complication of anesthesia    difficulty waking up  . Diverticulosis   . Gastritis   . GERD (gastroesophageal reflux disease)   . Hemorrhoids   . Hyperlipidemia   . Hypertension   . Hypothyroidism   . Osteoporosis     Patient Active Problem List   Diagnosis Date Noted  . UTI (urinary tract infection) 12/18/2016  . Dementia (HCC) 12/16/2016  . Essential hypertension 12/16/2016  .  Hypothyroidism 12/16/2016  . Hypokalemia 12/16/2016  . Lacunar stroke (HCC) 12/16/2016  . Aseptic loosening of prosthetic hip (HCC) 04/07/2012  . Degenerative arthritis of hip 11/29/2011  . Fatty liver 02/12/2011  . Abnormal liver function tests 01/31/2011  . GERD (gastroesophageal reflux disease) 01/31/2011  . Constipation, slow transit 01/31/2011  . Chronic RUQ pain 01/31/2011  . Esophageal reflux 12/18/2010  . Rectal bleeding 12/18/2010  . Chronic epigastric pain 12/18/2010  . Gastritis, chronic 12/18/2010  . Diverticulosis of colon (without mention of hemorrhage) 12/18/2010  . Liver function study, abnormal 12/18/2010    Past Surgical History:  Procedure Laterality Date  . ABDOMINAL HYSTERECTOMY    . BLADDER SURGERY     tacting and sling  . BONE SPURS     BOTH HEELS - NO SURGERY -JUST INJECTIONS  . FEMORAL HERNIA REPAIR    . JOINT REPLACEMENT     hip replacements times 2  . TONSILLECTOMY    . TONSILLECTOMY    . TOTAL HIP ARTHROPLASTY  11/29/2011   Procedure: TOTAL HIP ARTHROPLASTY ANTERIOR APPROACH;  Surgeon: Kathryne Hitch, MD;  Location: [facility] ORS;  Service: Orthopedics;  Laterality: Left;  . TOTAL HIP REVISION  04/07/2012   Procedure: TOTAL HIP REVISION;  Surgeon: Kathryne Hitch, MD;  Location: [facility] OR;  Service: Orthopedics;  Laterality: Right;  Revision right hip acetabular component  . VAGINAL  HYSTERECTOMY       OB History   No obstetric history on file.      Home Medications    Prior to Admission medications   Medication Sig Start Date End Date Taking? Authorizing Provider  amLODipine (NORVASC) 10 MG tablet Take 1 tablet (10 mg total) by mouth daily with breakfast. 12/18/16 01/17/17  Cleora Fleet, MD  aspirin EC 325 MG EC tablet Take 1 tablet (325 mg total) by mouth daily. Patient not taking: Reported on 04/11/2015 04/09/12   Kathryne Hitch, MD  Calcium Carbonate-Vitamin D 600-400 MG-UNIT per tablet Take 1 tablet by mouth daily.      [provider]  carvedilol (COREG) 12.5 MG tablet Take 1 tablet (12.5 mg total) by mouth 2 (two) times daily with a meal. 04/11/15   Lyndal Pulley, MD  cholecalciferol (VITAMIN D) 1000 UNITS tablet Take 2,000 Units by mouth daily.    [provider]  denosumab (PROLIA) 60 MG/ML SOLN injection Inject 60 mg into the skin every 6 (six) months.     [provider]  levothyroxine (SYNTHROID, LEVOTHROID) 50 MCG tablet Take 50 mcg by mouth daily with breakfast.     [provider]  memantine (NAMENDA) 10 MG tablet Take 10 mg by mouth 2 (two) times daily.     [provider]  valsartan-hydrochlorothiazide (DIOVAN HCT) 320-12.5 MG tablet Take 1 tablet by mouth daily. 12/18/16 01/17/17  Cleora Fleet, MD    Family History Family History  Problem Relation Age of Onset  . Uterine cancer Sister   . Breast cancer Sister   . Lung cancer Brother   . Cirrhosis Brother        drinker  . Alzheimer's disease Brother   . Colon cancer Neg Hx     Social History Social History   Tobacco Use  . Smoking status: Never Smoker  . Smokeless tobacco: Never Used  Substance Use Topics  . Alcohol use: No  . Drug use: No     Allergies   Sulfa antibiotics; Penicillins cross reactors; and Sulfa drugs cross reactors   Review of Systems Review of Systems  All other systems reviewed and are negative.    Physical Exam Updated Vital Signs BP (!) 167/144 (BP Location: Left Arm)   Pulse 72   Temp 98.3 F (36.8 C) (Oral)   Resp (!) 24   Ht 5\' 6"  (1.676 m)   Wt 73 kg   SpO2 98%   BMI 25.99 kg/m   Physical Exam Vitals signs and nursing note reviewed.  Constitutional:      Appearance: She is well-developed.  HENT:     Head: Normocephalic and atraumatic.  Cardiovascular:     Rate and Rhythm: Normal rate and regular rhythm.  Pulmonary:     Effort: Pulmonary effort is normal. No respiratory distress.  Abdominal:     Palpations: Abdomen is soft.      Tenderness: There is no abdominal tenderness. There is no guarding or rebound.  Musculoskeletal:        General: No tenderness.  Skin:    General: Skin is warm and dry.     Capillary Refill: Capillary refill takes less than 2 seconds.  Neurological:     Mental Status: She is alert.     Comments: Oriented to person. Disoriented to place, time in recent events. Five out of five strength in all four extremities.  Psychiatric:        Mood and Affect: Mood normal.  Comments: Calm and appropriate. No insight into recent activities/behaviors. Denies SI, HI, hallucinations.      ED Treatments / Results  Labs (all labs ordered are listed, but only abnormal results are displayed) Labs Reviewed  COMPREHENSIVE METABOLIC PANEL - Abnormal; Notable for the following components:      Result Value   Potassium 2.8 (*)    Glucose, Bld 123 (*)    AST 42 (*)    All other components within normal limits  CBC WITH DIFFERENTIAL/PLATELET - Abnormal; Notable for the following components:   Hemoglobin 15.1 (*)    All other components within normal limits  URINALYSIS, ROUTINE W REFLEX MICROSCOPIC - Abnormal; Notable for the following components:   Hgb urine dipstick MODERATE (*)    Leukocytes,Ua SMALL (*)    All other components within normal limits  RAPID URINE DRUG SCREEN, HOSP PERFORMED - Abnormal; Notable for the following components:   Benzodiazepines POSITIVE (*)    All other components within normal limits  URINE CULTURE  ETHANOL  MAGNESIUM    EKG EKG Interpretation  Date/Time:  Saturday Nov 14 2018 18:31:48 EDT Ventricular Rate:  65 PR Interval:    QRS Duration: 92 QT Interval:  426 QTC Calculation: 443 R Axis:   -11 Text Interpretation:  Sinus rhythm Anterior infarct, old No significant change since last tracing Confirmed by Tilden Fossaees, Zadiel Leyh [phone]([id number]) on 11/14/2018 7:21:59 PM   Radiology No results found.  Procedures Procedures (including critical care time)  Medications  Ordered in ED Medications  acetaminophen (TYLENOL) tablet 650 mg (has no administration in time range)  ondansetron (ZOFRAN) tablet 4 mg (has no administration in time range)  alum & mag hydroxide-simeth (MAALOX/MYLANTA) 200-200-20 MG/5ML suspension 30 mL (has no administration in time range)  potassium chloride SA (K-DUR) CR tablet 40 mEq (40 mEq Oral Given 11/14/18 2038)  carvedilol (COREG) tablet 12.5 mg (has no administration in time range)  dorzolamide-timolol (COSOPT) 22.3-6.8 MG/ML ophthalmic solution 1 drop (has no administration in time range)  levothyroxine (SYNTHROID) tablet 50 mcg (has no administration in time range)  LORazepam (ATIVAN) tablet 0.25 mg (0.25 mg Oral Given 11/14/18 2217)  memantine (NAMENDA) tablet 10 mg (has no administration in time range)  traZODone (DESYREL) tablet 50 mg (50 mg Oral Given 11/14/18 2216)  irbesartan (AVAPRO) tablet 300 mg (has no administration in time range)  hydrochlorothiazide (MICROZIDE) capsule 12.5 mg (has no administration in time range)     Initial Impression / Assessment and Plan / ED Course  I have reviewed the triage vital signs and the nursing notes.  Pertinent labs & imaging results that were available during my care of the patient were reviewed by me and considered in my medical decision making (see chart for details).        Patient with history of dementia here for evaluation of agitation. On ED assessment she is calm but confused, non-toxic appearing. She has been medically cleared for psychiatric evaluation and treatment. UA not consistent with UTI in setting of current symptoms.  BMP with mild hypokalemia, will replace orally. She is hypertensive but missed her home medications, will restart these.  Final Clinical Impressions(s) / ED Diagnoses   Final diagnoses:  Agitation  Dementia with behavioral disturbance, unspecified dementia type [facility](HCC)  Hypokalemia    ED Discharge Orders    None       Tilden Fossaees, Breda Bond, MD  11/14/18 Graylon Gunning1945    Tilden Fossaees, Jillian Warth, MD 11/14/18 2246

## 2018-11-15 LAB — URINE CULTURE: Culture: <10000 — AB

## 2018-11-15 MED ORDER — HYDROCHLOROTHIAZIDE 12.5 MG PO CAPS
12.5000 mg | ORAL_CAPSULE | Freq: Once | ORAL | Status: AC
  Administered 2018-11-15: 12.5 mg via ORAL
  Filled 2018-11-15: qty 1

## 2018-11-15 MED ORDER — HALOPERIDOL LACTATE 5 MG/ML IJ SOLN
2.0000 mg | Freq: Once | INTRAMUSCULAR | Status: AC
  Administered 2018-11-15: 2 mg via INTRAVENOUS
  Filled 2018-11-15: qty 1

## 2018-11-15 MED ORDER — HALOPERIDOL LACTATE 5 MG/ML IJ SOLN
2.0000 mg | Freq: Once | INTRAMUSCULAR | Status: AC
  Administered 2018-11-15: 5 mg via INTRAVENOUS
  Filled 2018-11-15: qty 1

## 2018-11-15 MED ORDER — DIVALPROEX SODIUM 250 MG PO DR TAB
250.0000 mg | DELAYED_RELEASE_TABLET | Freq: Two times a day (BID) | ORAL | Status: DC
  Administered 2018-11-15 – 2018-11-17 (×6): 250 mg via ORAL
  Filled 2018-11-15 (×7): qty 1

## 2018-11-15 MED ORDER — CARVEDILOL 12.5 MG PO TABS
12.5000 mg | ORAL_TABLET | Freq: Once | ORAL | Status: AC
  Administered 2018-11-15: 12.5 mg via ORAL
  Filled 2018-11-15: qty 1

## 2018-11-15 NOTE — Progress Notes (Signed)
CSW notified that there are no gero psych beds available at [facility] currently, per [employer].  TTS will continue to seek placement.  Vilma Meckel. Algis Greenhouse, MSW, LCSW Clinical Social Work/Disposition Phone: [phone] Fax: [phone]

## 2018-11-15 NOTE — ED Notes (Signed)
She continues to be agitated. Will continue to monitor.

## 2018-11-15 NOTE — ED Notes (Signed)
She is much calmer and in no distress. Her sitter remains with her at all times, as she has for this entire shift.

## 2018-11-15 NOTE — ED Notes (Signed)
Walked patient to the bathroom  

## 2018-11-15 NOTE — ED Notes (Signed)
Pt sitting up eating. Continues to be irritable, angry and disoriented.

## 2018-11-15 NOTE — ED Notes (Signed)
Pt placed on cardiac monitor. Sitter observing pt.

## 2018-11-15 NOTE — Progress Notes (Signed)
CSW received a call from [employer] requesting updated vitals for pt. These were faxed over for review.  TTS will continue to seek bed placement.  Vilma Meckel. Algis Greenhouse, MSW, LCSW Clinical Social Work/Disposition Phone: [phone] Fax: [phone]

## 2018-11-15 NOTE — Progress Notes (Signed)
Patient meets criteria for inpatient treatment. No appropriate or available beds at [facility]. CSW faxed referrals to the following facilities for review:  [facility]  [facility]  [facility]  [facility]  [facility]  [facility]  [facility]  [facility]  [facility]  [facility]  [facility]   TTS will continue to seek bed placement.  Alvina Strother R. Lanitra Battaglini, MSW, LCSW Clinical Social Work/Disposition Phone: [phone] Fax: [phone]      

## 2018-11-15 NOTE — ED Notes (Signed)
ONE BAG OF BELONGINGS PLACED ON LOCKER: INCLUDED HER CLOTHING AND SHOES.  SHE REFUSES TO GIVE UP  HER PURSE AND WANTS TO KEEP IT IN HER ROOM.

## 2018-11-15 NOTE — ED Notes (Signed)
Having difficulty keeping monitor on pt. She pulls the leads off and is trying to pull her IV out.

## 2018-11-15 NOTE — ED Notes (Signed)
She ambulates to b.r. and back with her sitter. She remains in no distress and remains calm.

## 2018-11-15 NOTE — Clinical Social Work Note (Signed)
CSW spoke with Gaynelle Adu at [facility] and he is working on placement for patient to go to [facility].  This CSW to sign off please reconsult if social work needs arise.  Windell Moulding, MSW, LCSW ED Covering CSW [phone] 11/15/2018 12:17 PM

## 2018-11-15 NOTE — Progress Notes (Signed)
Patient needs placement in a geriatric psych at this time.  Nanine Means, PMHNP

## 2018-11-15 NOTE — ED Notes (Signed)
Pt assisted to BR. Continues to be agitated and irritable. [place]

## 2018-11-15 NOTE — ED Notes (Signed)
Bed: WA29 Expected date:  Expected time:  Means of arrival:  Comments: Hold for 16 

## 2018-11-15 NOTE — ED Notes (Signed)
MD notified of vital signs

## 2018-11-15 NOTE — ED Notes (Signed)
She suddenly becomes very agitated with paranoia. Med. Given per order of Dr. Anitra Lauth.

## 2018-11-15 NOTE — ED Notes (Signed)
Patient is not sleeping or resting even after extra dose of haldol during day shift.  Intermittently getting in and out of the bed.  VSS, still on heart monitor. Sitter at bedside. Easy redirect.

## 2018-11-15 NOTE — ED Notes (Signed)
She ambulates well. Her agitation continues unabated. Dr. Anitra Lauth notified.

## 2018-11-15 NOTE — ED Notes (Signed)
Security were able to pt pt's purse in her locker.

## 2018-11-16 ENCOUNTER — Emergency Department (HOSPITAL_COMMUNITY): Payer: Medicare HMO

## 2018-11-16 DIAGNOSIS — R451 Restlessness and agitation: Secondary | ICD-10-CM

## 2018-11-16 DIAGNOSIS — F0391 Unspecified dementia with behavioral disturbance: Secondary | ICD-10-CM

## 2018-11-16 LAB — SARS CORONAVIRUS 2 BY RT PCR (HOSPITAL ORDER, PERFORMED IN ~~LOC~~ HOSPITAL LAB): SARS Coronavirus 2: NEGATIVE

## 2018-11-16 MED ORDER — OLANZAPINE 10 MG IM SOLR: 5.0000 mg | Freq: Two times a day (BID) | INTRAMUSCULAR | Status: DC | PRN

## 2018-11-16 MED ORDER — HYDRALAZINE HCL 10 MG PO TABS
10.0000 mg | ORAL_TABLET | Freq: Four times a day (QID) | ORAL | Status: DC
  Administered 2018-11-16 – 2018-11-18 (×7): 10 mg via ORAL
  Filled 2018-11-16 (×8): qty 1

## 2018-11-16 NOTE — ED Notes (Signed)
Notified MD of bp.  Order placed.

## 2018-11-16 NOTE — BH Assessment (Signed)
[facility] Assessment Progress Note  Per Juanetta Beets, DO, this pt requires psychiatric hospitalization at this time.  Pt presents under IVC initiated by pt's son, which EDP Tilden Fossa, MD has upheld.  The following facilities have been contacted to seek placement for this pt, with results as noted:  Beds available, information sent, decision pending:  Derald Macleod, [place] Behavior[facility] Coordinator [phone]

## 2018-11-16 NOTE — Progress Notes (Signed)
Given Coreg for elevated BP.  Will continue to monitor.

## 2018-11-16 NOTE — Consult Note (Signed)
[facility]   Reason for Consult:  Agitation  Referring Physician:  EDP Patient Identification: Julie [name] MRN:  [id number] Principal Diagnosis: Dementia (HCC) Diagnosis:  Principal Problem:   Dementia (HCC)   Total Time spent with patient: 30 minutes  Subjective:   Julie [name] is a 85 y.o. female patient admitted with agitation.  HPI:   Per chart review, patient was admitted with worsening agitation in the setting of dementia. Her son is unable to care for her at home. She was attempting to break into her vehicle with a screwdriver. She is no longer able to drive. She has been intermittently agitated in the ED requiring behavioral medications. Today, she is only oriented to person. She reports that she is okay while eating lunch. She denies SI or HI. She does not answer the question regarding AVH. She reports that she lives alone and attends to her ADLs independently although she appears to live with her son per medical record.   Past Psychiatric History: Dementia   Risk to Self: Suicidal Ideation: No Suicidal Intent: No Is patient at risk for suicide?: No Suicidal Plan?: No Access to Means: No What has been your use of drugs/alcohol within the last 12 months?: Pt denies How many times?: 0 Other Self Harm Risks: None Triggers for Past Attempts: None known Intentional Self Injurious Behavior: None Risk to Others: Homicidal Ideation: No Thoughts of Harm to Others: No Current Homicidal Intent: No Current Homicidal Plan: No Access to Homicidal Means: No Identified Victim: None History of harm to others?: No Assessment of Violence: On admission Violent Behavior Description: Per ED note, Pt was agitated and trying to get into a car with a screwdriver Does patient have access to weapons?: No Criminal Charges Pending?: No Does patient have a court date: No Prior Inpatient Therapy: Prior Inpatient Therapy: No Prior Outpatient Therapy: Prior  Outpatient Therapy: No Does patient have an ACCT team?: No Does patient have Intensive In-House Services?  : No Does patient have [facility] services? : No Does patient have P4CC services?: No  Past Medical History:  Past Medical History:  Diagnosis Date  . Anxiety   . Arthritis    SEVERE OA LEFT HIP  - S/P RIGHT HIP REPLACEMENT / OCCASIONAL BACK AND NECK PAIN  . Complication of anesthesia    difficulty waking up  . Diverticulosis   . Gastritis   . GERD (gastroesophageal reflux disease)   . Hemorrhoids   . Hyperlipidemia   . Hypertension   . Hypothyroidism   . Osteoporosis     Past Surgical History:  Procedure Laterality Date  . ABDOMINAL HYSTERECTOMY    . BLADDER SURGERY     tacting and sling  . BONE SPURS     BOTH HEELS - NO SURGERY -JUST INJECTIONS  . FEMORAL HERNIA REPAIR    . JOINT REPLACEMENT     hip replacements times 2  . TONSILLECTOMY    . TONSILLECTOMY    . TOTAL HIP ARTHROPLASTY  11/29/2011   Procedure: TOTAL HIP ARTHROPLASTY ANTERIOR APPROACH;  Surgeon: Kathryne Hitchhristopher Y Blackman, MD;  Location: [facility];  Service: Orthopedics;  Laterality: Left;  . TOTAL HIP REVISION  04/07/2012   Procedure: TOTAL HIP REVISION;  Surgeon: Kathryne Hitchhristopher Y Blackman, MD;  [facility];  Service: Orthopedics;  Laterality: Right;  Revision right hip acetabular component  . VAGINAL HYSTERECTOMY     Family History:  Family History  Problem Relation Age of Onset  . Uterine cancer Sister   .  Breast cancer Sister   . Lung cancer Brother   . Cirrhosis Brother        drinker  . Alzheimer's disease Brother   . Colon cancer Neg Hx    Family Psychiatric  History: Brother-Alzheimer's disease.  Social History:  Social History   Substance and Sexual Activity  Alcohol Use No     Social History   Substance and Sexual Activity  Drug Use No    Social History   Socioeconomic History  . Marital status: Married    Spouse name: Not on file  . Number of children: 1  . Years of education:  Not on file  . Highest education level: Not on file  Occupational History  . Occupation: retired    Associate Professor: RETIRED  Social Needs  . Financial resource strain: Not on file  . Food insecurity:    Worry: Not on file    Inability: Not on file  . Transportation needs:    Medical: Not on file    Non-medical: Not on file  Tobacco Use  . Smoking status: Never Smoker  . Smokeless tobacco: Never Used  Substance and Sexual Activity  . Alcohol use: No  . Drug use: No  . Sexual activity: Not on file  Lifestyle  . Physical activity:    Days per week: Not on file    Minutes per session: Not on file  . Stress: Not on file  Relationships  . Social connections:    Talks on phone: Not on file    Gets together: Not on file    Attends religious service: Not on file    Active member of club or organization: Not on file    Attends meetings of clubs or organizations: Not on file    Relationship status: Not on file  Other Topics Concern  . Not on file  Social History Narrative  . Not on file   Additional Social History: She lives at home with her son.     Allergies:   Allergies  Allergen Reactions  . Sulfa Antibiotics Rash  . Penicillins Cross Reactors Rash    Did it involve swelling of the face/tongue/throat, SOB, or low BP? No Did it involve sudden or severe rash/hives, skin peeling, or any reaction on the inside of your mouth or nose? No Did you need to seek medical attention at a hospital or doctor's office? No When did it last happen? If all above answers are "NO", may proceed with cephalosporin use.   . Sulfa Drugs Cross Reactors Rash    Labs:  Results for orders placed or performed during the hospital encounter of 11/14/18 (from the past 48 hour(s))  Comprehensive metabolic panel     Status: Abnormal   Collection Time: 11/14/18  6:07 PM  Result Value Ref Range   Sodium 139 135 - 145 mmol/L   Potassium 2.8 (L) 3.5 - 5.1 mmol/L   Chloride 99 98 - 111 mmol/L   CO2  29 22 - 32 mmol/L   Glucose, Bld 123 (H) 70 - 99 mg/dL   BUN 18 8 - 23 mg/dL   Creatinine, Ser 4.09 0.44 - 1.00 mg/dL   Calcium 9.3 8.9 - 81.1 mg/dL   Total Protein 7.6 6.5 - 8.1 g/dL   Albumin 4.6 3.5 - 5.0 g/dL   AST 42 (H) 15 - 41 U/L   ALT 19 0 - 44 U/L   Alkaline Phosphatase 82 38 - 126 U/L   Total Bilirubin 0.5 0.3 -  1.2 mg/dL   GFR calc non Af Amer >60 >60 mL/min   GFR calc Af Amer >60 >60 mL/min   Anion gap 11 5 - 15    Comment: Performed at [facility], [address] [place], [place] [address]  Ethanol     Status: None   Collection Time: 11/14/18  6:07 PM  Result Value Ref Range   Alcohol, Ethyl (B) <10 <10 mg/dL    Comment: (NOTE) Lowest detectable limit for serum alcohol is 10 mg/dL. For medical purposes only. Performed at [facility], [address], [place], [place] [address]   CBC with Differential     Status: Abnormal   Collection Time: 11/14/18  6:07 PM  Result Value Ref Range   WBC 9.5 4.0 - 10.5 K/uL   RBC 4.77 3.87 - 5.11 MIL/uL   Hemoglobin 15.1 (H) 12.0 - 15.0 g/dL   HCT 09.8 11.9 - 14.7 %   MCV 92.7 80.0 - 100.0 fL   MCH 31.7 26.0 - 34.0 pg   MCHC 34.2 30.0 - 36.0 g/dL   RDW 82.9 56.2 - 13.0 %   Platelets 226 150 - 400 K/uL   nRBC 0.0 0.0 - 0.2 %   Neutrophils Relative % 70 %   Neutro Abs 6.7 1.7 - 7.7 K/uL   Lymphocytes Relative 18 %   Lymphs Abs 1.7 0.7 - 4.0 K/uL   Monocytes Relative 11 %   Monocytes Absolute 1.0 0.1 - 1.0 K/uL   Eosinophils Relative 1 %   Eosinophils Absolute 0.1 0.0 - 0.5 K/uL   Basophils Relative 0 %   Basophils Absolute 0.0 0.0 - 0.1 K/uL   Immature Granulocytes 0 %   Abs Immature Granulocytes 0.02 0.00 - 0.07 K/uL    Comment: Performed at [facility], [address] [place], [place] [address]  Urinalysis, Routine w reflex microscopic     Status: Abnormal   Collection Time: 11/14/18  6:07 PM  Result Value Ref Range   Color, Urine YELLOW YELLOW    APPearance CLEAR CLEAR   Specific Gravity, Urine 1.016 1.005 - 1.030   pH 6.0 5.0 - 8.0   Glucose, UA NEGATIVE NEGATIVE mg/dL   Hgb urine dipstick MODERATE (A) NEGATIVE   Bilirubin Urine NEGATIVE NEGATIVE   Ketones, ur NEGATIVE NEGATIVE mg/dL   Protein, ur NEGATIVE NEGATIVE mg/dL   Nitrite NEGATIVE NEGATIVE   Leukocytes,Ua SMALL (A) NEGATIVE   RBC / HPF 6-10 0 - 5 RBC/hpf   WBC, UA 6-10 0 - 5 WBC/hpf   Bacteria, UA NONE SEEN NONE SEEN   Squamous Epithelial / LPF 0-5 0 - 5   Mucus PRESENT     Comment: Performed at [facility], [address] [place], [place] [address]  Urine culture     Status: Abnormal   Collection Time: 11/14/18  6:07 PM  Result Value Ref Range   Specimen Description      URINE, RANDOM Performed at [facility], [address], [place], [place] [address]    Special Requests      NONE Performed at [facility], [address], [place], [place] [address]    Culture (A)     <10,000 COLONIES/mL INSIGNIFICANT GROWTH Performed at [facility], [address], [place], [place] [address]    Report Status 11/15/2018 FINAL   Urine rapid drug screen (hosp performed)     Status: Abnormal   Collection Time: 11/14/18  6:07 PM  Result Value Ref Range   Opiates NONE DETECTED NONE DETECTED   Cocaine NONE DETECTED NONE DETECTED   Benzodiazepines POSITIVE (A) NONE DETECTED   Amphetamines NONE DETECTED NONE DETECTED   Tetrahydrocannabinol NONE DETECTED NONE DETECTED   Barbiturates NONE DETECTED NONE DETECTED    Comment: (NOTE) DRUG SCREEN FOR MEDICAL PURPOSES ONLY.  IF CONFIRMATION IS NEEDED FOR ANY PURPOSE, NOTIFY LAB WITHIN 5 DAYS. LOWEST DETECTABLE LIMITS FOR URINE DRUG SCREEN Drug Class                     Cutoff (ng/mL) Amphetamine and metabolites    1000 Barbiturate and metabolites    200 Benzodiazepine                 200 Tricyclics and metabolites     300 Opiates and metabolites         300 Cocaine and metabolites        300 THC                            50 Performed at [facility], [address], [place], [place] [address]   Magnesium     Status: None   Collection Time: 11/14/18 10:04 PM  Result Value Ref Range   Magnesium 2.3 1.7 - 2.4 mg/dL    Comment: Performed at [facility], [address] [place], [place] [address]    Current Facility-Administered Medications  Medication Dose Route Frequency Provider Last Rate Last Dose  . acetaminophen (TYLENOL) tablet 650 mg  650 mg Oral Q4H PRN Tilden Fossa, MD      . alum & mag hydroxide-simeth (MAALOX/MYLANTA) 200-200-20 MG/5ML suspension 30 mL  30 mL Oral Q6H PRN Tilden Fossa, MD      . carvedilol (COREG) tablet 12.5 mg  12.5 mg Oral BID WC Tilden Fossa, MD   12.5 mg at 11/16/18 0708  . divalproex (DEPAKOTE) DR tablet 250 mg  250 mg Oral Q12H Charm Rings, NP   250 mg at 11/16/18 0943  . dorzolamide-timolol (COSOPT) 22.3-6.8 MG/ML ophthalmic solution 1 drop  1 drop Both Eyes BID Tilden Fossa, MD   1 drop at 11/16/18 [phone]  . hydrochlorothiazide (MICROZIDE) capsule 12.5 mg  12.5 mg Oral Daily Tilden Fossa, MD   12.5 mg at 11/16/18 0943  . irbesartan (AVAPRO) tablet 300 mg  300 mg Oral Daily Tilden Fossa, MD   300 mg at 11/16/18 0943  . levothyroxine (SYNTHROID) tablet 50 mcg  50 mcg Oral Q0600 Tilden Fossa, MD   50 mcg at 11/16/18 [phone]  . LORazepam (ATIVAN) tablet 0.25 mg  0.25 mg Oral TID PRN Tilden Fossa, MD   0.25 mg at 11/15/18 2228  . memantine (NAMENDA) tablet 10 mg  10 mg Oral BID Tilden Fossa, MD   10 mg at 11/16/18 0943  . OLANZapine (ZYPREXA) injection 5 mg  5 mg Intramuscular Q12H PRN Charm Rings, NP      . ondansetron [facility]) tablet 4 mg  4 mg Oral Q8H PRN Tilden Fossa, MD      . potassium chloride SA (K-DUR) CR tablet 40 mEq  40 mEq Oral Daily Tilden Fossa, MD   40 mEq at 11/16/18 0943  . traZODone (DESYREL) tablet 50 mg  50 mg  Oral QHS Tilden Fossa, MD   50 mg at 11/15/18 2227   Current Outpatient Medications  Medication Sig Dispense Refill  .  Calcium Carbonate-Vitamin D 600-400 MG-UNIT per tablet Take 1 tablet by mouth daily.     . carvedilol (COREG) 12.5 MG tablet Take 1 tablet (12.5 mg total) by mouth 2 (two) times daily with a meal. 30 tablet 0  . cholecalciferol (VITAMIN D) 1000 UNITS tablet Take 2,000 Units by mouth daily.    Marland Kitchen denosumab (PROLIA) 60 MG/ML SOLN injection Inject 60 mg into the skin every 6 (six) months.     . diphenhydramine-acetaminophen (TYLENOL PM) 25-500 MG TABS tablet Take 1 tablet by mouth at bedtime as needed (sleep).    . furosemide (LASIX) 20 MG tablet Take 20 mg by mouth daily.     Marland Kitchen levothyroxine (SYNTHROID, LEVOTHROID) 50 MCG tablet Take 50 mcg by mouth daily with breakfast.     . LORazepam (ATIVAN) 1 MG tablet Take 0.5-1 mg by mouth every 8 (eight) hours.     . memantine (NAMENDA) 10 MG tablet Take 10 mg by mouth 2 (two) times daily.     . potassium chloride SA (K-DUR) 20 MEQ tablet Take 20 mEq by mouth daily.    . risperiDONE (RISPERDAL) 0.25 MG tablet Take 0.25-0.5 mg by mouth See admin instructions. 0.25 in the am and 0.25 lunch 0.5mg  at bedtime    . valsartan-hydrochlorothiazide (DIOVAN HCT) 320-12.5 MG tablet Take 1 tablet by mouth daily. 30 tablet 0  . amLODipine (NORVASC) 10 MG tablet Take 1 tablet (10 mg total) by mouth daily with breakfast. (Patient not taking: Reported on 11/14/2018) 30 tablet 0  . aspirin EC 325 MG EC tablet Take 1 tablet (325 mg total) by mouth daily. (Patient not taking: Reported on 04/11/2015) 30 tablet 0    Musculoskeletal: Strength & Muscle Tone: Patient moves all extremities spontaneously. Gait & Station: UTA since patient is lying in bed. Patient leans: N/A  Psychiatric Specialty Exam: Physical Exam  Nursing note and vitals reviewed. Constitutional: She appears well-developed and well-nourished.  HENT:  Head: Normocephalic and atraumatic.   Neck: Normal range of motion.  Respiratory: Effort normal.  Musculoskeletal: Normal range of motion.  Neurological: She is alert.  Only oriented to self.  Psychiatric: She has a normal mood and affect. Her speech is normal and behavior is normal. Thought content normal. Cognition and memory are impaired. She expresses impulsivity.    Review of Systems  Psychiatric/Behavioral: Negative for suicidal ideas.  All other systems reviewed and are negative.   Blood pressure (!) 192/81, pulse (!) 59, temperature 97.9 F (36.6 C), temperature source Oral, resp. rate 16, height  (1.676 m), weight 73 kg, SpO2 96 %.Body mass index is 25.99 kg/m.  General Appearance: Fairly Groomed, elderly, Caucasian female who is lying in bed. NAD.   Eye Contact:  Good  Speech:  Clear and Coherent and Normal Rate  Volume:  Normal  Mood:  Euthymic  Affect:  Constricted  Thought Process:  Linear and Descriptions of Associations: Intact  Orientation:  Other:  Only oriented to self.  Thought Content:  Logical  Suicidal Thoughts:  No  Homicidal Thoughts:  No  Memory:  Immediate;   Poor Recent;   Poor Remote;   Poor  Judgement:  Impaired  Insight:  Lacking  Psychomotor Activity:  Normal  Concentration:  Concentration: Fair and Attention Span: Fair  Recall:  Poor  Fund of Knowledge:  Poor  Language:  Fair  Akathisia:  No  Handed:  Right  AIMS (if indicated):   N/A  Assets:  Housing Resilience Social Support  ADL's:  Impaired  Cognition: Impaired. History of dementia.   Sleep:   N/A   Assessment:  SIDONIE ROSANDER is a 85 y.o. female who was admitted with worsening agitation in the setting of dementia. Patient's son is no longer able to care for her at home. She has been intermittently agitated in the hospital requiring behavioral medications. She warrants inpatient psychiatric hospitalization for stabilization and treatment.   Treatment Plan Summary: Daily contact with patient to assess and  evaluate symptoms and progress in treatment and Medication management -Continue Depakote 250 mg BID for mood stabilization.  -Continue Namenda 10 mg BID for dementia.  -Continue Trazodone 50 mg qhs for insomnia/agitation.   Disposition: Recommend psychiatric Inpatient admission when medically cleared.  Cherly Beach, DO 11/16/2018 1:43 PM

## 2018-11-17 LAB — POTASSIUM: Potassium: 3.3 mmol/L — ABNORMAL LOW (ref 3.5–5.1)

## 2018-11-17 MED ORDER — LORAZEPAM 1 MG PO TABS: 0.5000 mg | ORAL_TABLET | Freq: Three times a day (TID) | ORAL | 0 refills | Status: AC | PRN

## 2018-11-17 MED ORDER — TRAZODONE HCL 50 MG PO TABS: 50.0000 mg | ORAL_TABLET | Freq: Every day | ORAL | 0 refills | Status: AC

## 2018-11-17 MED ORDER — TUBERCULIN PPD 5 UNIT/0.1ML ID SOLN
5.0000 [IU] | Freq: Once | INTRADERMAL | Status: DC
  Administered 2018-11-17: 5 [IU] via INTRADERMAL
  Filled 2018-11-17: qty 0.1

## 2018-11-17 MED ORDER — DIVALPROEX SODIUM 250 MG PO DR TAB: 250.0000 mg | DELAYED_RELEASE_TABLET | Freq: Two times a day (BID) | ORAL | 0 refills | Status: AC

## 2018-11-17 NOTE — ED Notes (Signed)
Pt awake, alert & responsive, no distress noted, calm & cooperative.  Sitter at bedside. Monitoring for safety,   Pending TB test and possible transport to [facility].

## 2018-11-17 NOTE — BHH Suicide Risk Assessment (Addendum)
[facility] Discharge Suicide Risk Assessment   Principal Problem: Dementia [facility]) Discharge Diagnoses: Principal Problem:   Dementia (HCC)   Total Time spent with patient: 15 minutes  Musculoskeletal: Strength & Muscle Tone: Spontaneously moves all 4 extremities.  Gait & Station: UTA since patient is sitting in bed. Patient leans: N/A  Psychiatric Specialty Exam: Review of Systems  Psychiatric/Behavioral: Negative for depression, hallucinations and suicidal ideas.  All other systems reviewed and are negative.   Blood pressure (!) 143/93, pulse 60, temperature 98 F (36.7 C), temperature source Oral, resp. rate 14, height 5\' 6"  (1.676 m), weight 73 kg, SpO2 97 %.Body mass index is 25.99 kg/m.  General Appearance: Fairly Groomed, elderly, Caucasian female who is sitting in bed and eating lunch. NAD.   Eye Contact::  Good  Speech:  Clear and Coherent and Normal Rate  Volume:  Normal  Mood:  Euthymic  Affect:  Appropriate and Congruent  Thought Process:  Linear and Descriptions of Associations: Intact  Orientation:  Other:  Only oriented to person.  Thought Content:  Logical  Suicidal Thoughts:  No  Homicidal Thoughts:  No  Memory:  Immediate;   Poor Recent;   Fair Remote;   Fair  Judgement:  Poor  Insight:  Shallow  Psychomotor Activity:  Normal  Concentration:  Good  Recall:  Poor  Fund of Knowledge:Fair  Language: Fair  Akathisia:  No  Handed:  Right  AIMS (if indicated):   N/A  Assets:  Engineer, maintenance Physical Health Resilience Social Support  Sleep:   Fair  Cognition: Impaired. History of dementia.   ADL's:  Intact   Mental Status Per Nursing Assessment::   On Admission:   "Pt is dressed in hospital scrubs, alert, oriented to person and knows she is in a hospital. Pt speaks in a clear tone, at moderate volume and normal pace. Motor behavior appears normal. Eye contact is good. Pt's mood is mildly depressed and affect is congruent with mood. Thought  process is coherent and relevant. There is no indication Pt is currently responding to internal stimuli or experiencing delusional thought content. Pt was pleasant and cooperative throughout assessment."  Demographic Factors:  Age 85 or older, Caucasian, Living alone and Unemployed  Loss Factors: Decrease in vocational status  Historical Factors: Impulsivity  Risk Reduction Factors:   Positive social support  Continued Clinical Symptoms:  Medical Diagnoses and Treatments/Surgeries  Cognitive Features That Contribute To Risk:  None    Suicide Risk:  Minimal: No identifiable suicidal ideation.  Patients presenting with no risk factors but with morbid ruminations; may be classified as minimal risk based on the severity of the depressive symptoms    Plan Of Care/Follow-up recommendations:  -Continue psychotropic medications as prescribed. -Follow up with outpatient provider.   Cherly Beach, DO 11/17/2018, 12:18 PM

## 2018-11-17 NOTE — Progress Notes (Signed)
CSW received a call from [facility] stating the FL-2 she received via fax was not signed and would need the AVS faxed to her, as well at fax: [phone].  CSW will continue to follow for D/C needs.  Dorothe Pea. Kimiya Brunelle, LCSW, LCAS, CSI Transitions of Care Clinical Social Worker Care Coordination Department Ph: [phone]

## 2018-11-17 NOTE — ED Notes (Signed)
Spoke with Toni Amend at [facility], [phone] and Facility requests that pt be transferred in am.  TB skin test placed to Right anterior forearm.  Pt tolerated well.  Charge Nurse [employer] notified.

## 2018-11-17 NOTE — ED Notes (Signed)
Report given to [facility] at [facility].  [facility] called for transport.

## 2018-11-17 NOTE — BH Assessment (Signed)
[facility] Assessment Progress Note  Per Juanetta Beets, DO, this pt no longer requires psychiatric hospitalization at this time.  Pt is to be released from IVC and discharged from [facility].  EDP Virgina Norfolk, DO concurs with this decision, and has released pt from IVC initiated by pt's son and formerly upheld by EDP Tilden Fossa, MD.  At the request of Dr Sharma Covert, this writer called pt's son, Shakthi Janus [phone]), to notify him of pt's disposition.  Call was placed at 12:00.  Mr Kanning reports that he is trying to have pt admitted to a memory care facility, which has told him that pt needs to be admitted to [facility] for evaluation first.  I informed him that pt has been seen by a psychiatrist today, who has determined that pt does not present a danger to herself or others.  Pt therefore does not meet criteria for psychiatric hospitalization or for involuntary commitment, and is to be discharged from [facility]  An unidentified female, overhearing conversation on Mr Faidley's speaker phone, reports that they will contact the memory care facility and will call me back.  I informed them that I am in the office until 16:30 today, and asked that they get back in touch with me as soon as possible, to which they agreed.  Pt's nurse, Aram Beecham, has been notified.  Doylene Canning, MA Triage Specialist [phone]

## 2018-11-17 NOTE — Progress Notes (Addendum)
CSW received a call from [facility] Pt has been accepted by: [facility] in [place] [facility] Number for report is: [phone] Pt's unit/room/bed number will be: Room C8 Accepting physician: ALF MD   Pt can arrive ASAP on 11/17/18.  Pt is NOT IVC'd at this time, per disposition.  CSW will update RN updated.Dorothe Pea. Kaimani Clayson, LCSW, LCAS, CSI Clinical Social Worker Ph: [phone]

## 2018-11-17 NOTE — Progress Notes (Addendum)
CSW spoke to patient's son Lilly Spenser [phone]). CSW informed patient's son that patient has been cleared medically and psychiatrically and has been discharged from the ED. Patient's son and an unidentified woman in the background requested CSW send FL2 to [facility] facility. Patient's son reports they he has already spoke with [facility]. The women provided CSW with the fax number. Patient's son stated that will he will pick up patient today. Patient will wait for [facility] admission decision at home. CSW completed FL2 and faxed it to [facility] (fax #[phone]).  Geralyn Corwin, LCSW Transitions of Care Department [facility] ED [phone]

## 2018-11-17 NOTE — Consult Note (Addendum)
Telepsych Consultation   Reason for Consult:  Agitation  Referring Physician:  EDP Location of Patient: [facility]-EMERG[facility]Y DEPT Location of Provider: [facility]  Patient Identification: LALAH DURANGO MRN:  [id number] Principal Diagnosis: Dementia [facility]) Diagnosis:  Principal Problem:   Dementia (HCC)   Total Time spent with patient: 15 minutes  Subjective:   KALIMA SAYLOR is a 85 y.o. female patient admitted with worsening agitation in the setting of dementia.  HPI:   Per chart review, patient was admitted with worsening agitation in the setting of dementia. Her son is unable to care for her at home. She was attempting to break into her vehicle with a screwdriver. She is no longer able to drive. She has been intermittently agitated in the ED requiring behavioral medications. She did well overnight and did not require medication for behavioral issues. On interview, she is only oriented to person. She reports that she is doing well. She is pleasant and eating lunch. She denies SI, HI or AVH. She reports that she lives at home alone most of the time and is independent of her ADLs.    Past Psychiatric History: Dementia   Risk to Self: Suicidal Ideation: No Suicidal Intent: No Is patient at risk for suicide?: No Suicidal Plan?: No Access to Means: No What has been your use of drugs/alcohol within the last 12 months?: Pt denies How many times?: 0 Other Self Harm Risks: None Triggers for Past Attempts: None known Intentional Self Injurious Behavior: None Risk to Others: Homicidal Ideation: No Thoughts of Harm to Others: No Current Homicidal Intent: No Current Homicidal Plan: No Access to Homicidal Means: No Identified Victim: None History of harm to others?: No Assessment of Violence: On admission Violent Behavior Description: Per ED note, Pt was agitated and trying to get into a car with a screwdriver Does patient have access to weapons?: No Criminal Charges  Pending?: No Does patient have a court date: No Prior Inpatient Therapy: Prior Inpatient Therapy: No Prior Outpatient Therapy: Prior Outpatient Therapy: No Does patient have an ACCT team?: No Does patient have Intensive In-House Services?  : No Does patient have [facility] services? : No Does patient have P4CC services?: No  Past Medical History:  Past Medical History:  Diagnosis Date  . Anxiety   . Arthritis    SEVERE OA LEFT HIP  - S/P RIGHT HIP REPLACEMENT / OCCASIONAL BACK AND NECK PAIN  . Complication of anesthesia    difficulty waking up  . Diverticulosis   . Gastritis   . GERD (gastroesophageal reflux disease)   . Hemorrhoids   . Hyperlipidemia   . Hypertension   . Hypothyroidism   . Osteoporosis     Past Surgical History:  Procedure Laterality Date  . ABDOMINAL HYSTERECTOMY    . BLADDER SURGERY     tacting and sling  . BONE SPURS     BOTH HEELS - NO SURGERY -JUST INJECTIONS  . FEMORAL HERNIA REPAIR    . JOINT REPLACEMENT     hip replacements times 2  . TONSILLECTOMY    . TONSILLECTOMY    . TOTAL HIP ARTHROPLASTY  11/29/2011   Procedure: TOTAL HIP ARTHROPLASTY ANTERIOR APPROACH;  Surgeon: Kathryne Hitch, MD;  Location: [facility] ORS;  Service: Orthopedics;  Laterality: Left;  . TOTAL HIP REVISION  04/07/2012   Procedure: TOTAL HIP REVISION;  Surgeon: Kathryne Hitch, MD;  Location: [facility] OR;  Service: Orthopedics;  Laterality: Right;  Revision right hip acetabular component  . VAGINAL  HYSTERECTOMY     Family History:  Family History  Problem Relation Age of Onset  . Uterine cancer Sister   . Breast cancer Sister   . Lung cancer Brother   . Cirrhosis Brother        drinker  . Alzheimer's disease Brother   . Colon cancer Neg Hx    Family Psychiatric  History: Brother-Alzheimer's disease Social History:  Social History   Substance and Sexual Activity  Alcohol Use No     Social History   Substance and Sexual Activity  Drug Use No    Social History    Socioeconomic History  . Marital status: Married    Spouse name: Not on file  . Number of children: 1  . Years of education: Not on file  . Highest education level: Not on file  Occupational History  . Occupation: retired    Associate Professor: RETIRED  Social Needs  . Financial resource strain: Not on file  . Food insecurity:    Worry: Not on file    Inability: Not on file  . Transportation needs:    Medical: Not on file    Non-medical: Not on file  Tobacco Use  . Smoking status: Never Smoker  . Smokeless tobacco: Never Used  Substance and Sexual Activity  . Alcohol use: No  . Drug use: No  . Sexual activity: Not on file  Lifestyle  . Physical activity:    Days per week: Not on file    Minutes per session: Not on file  . Stress: Not on file  Relationships  . Social connections:    Talks on phone: Not on file    Gets together: Not on file    Attends religious service: Not on file    Active member of club or organization: Not on file    Attends meetings of clubs or organizations: Not on file    Relationship status: Not on file  Other Topics Concern  . Not on file  Social History Narrative  . Not on file   Additional Social History: N/A    Allergies:   Allergies  Allergen Reactions  . Sulfa Antibiotics Rash  . Penicillins Cross Reactors Rash    Did it involve swelling of the face/tongue/throat, SOB, or low BP? No Did it involve sudden or severe rash/hives, skin peeling, or any reaction on the inside of your mouth or nose? No Did you need to seek medical attention at a hospital or doctor's office? No When did it last happen? If all above answers are "NO", may proceed with cephalosporin use.   . Sulfa Drugs Cross Reactors Rash    Labs:  Results for orders placed or performed during the hospital encounter of 11/14/18 (from the past 48 hour(s))  SARS Coronavirus 2 (CEPHEID - Performed in [facility] hospital lab), Hosp Order     Status: None   Collection Time:  11/16/18  4:47 PM  Result Value Ref Range   SARS Coronavirus 2 NEGATIVE NEGATIVE    Comment: (NOTE) If result is NEGATIVE SARS-CoV-2 target nucleic acids are NOT DETECTED. The SARS-CoV-2 RNA is generally detectable in upper and lower  respiratory specimens during the acute phase of infection. The lowest  concentration of SARS-CoV-2 viral copies this assay can detect is 250  copies / mL. A negative result does not preclude SARS-CoV-2 infection  and should not be used as the sole basis for treatment or other  patient management decisions.  A negative result may occur  with  improper specimen collection / handling, submission of specimen other  than nasopharyngeal swab, presence of viral mutation(s) within the  areas targeted by this assay, and inadequate number of viral copies  (<250 copies / mL). A negative result must be combined with clinical  observations, patient history, and epidemiological information. If result is POSITIVE SARS-CoV-2 target nucleic acids are DETECTED. The SARS-CoV-2 RNA is generally detectable in upper and lower  respiratory specimens dur ing the acute phase of infection.  Positive  results are indicative of active infection with SARS-CoV-2.  Clinical  correlation with patient history and other diagnostic information is  necessary to determine patient infection status.  Positive results do  not rule out bacterial infection or co-infection with other viruses. If result is PRESUMPTIVE POSTIVE SARS-CoV-2 nucleic acids MAY BE PRESENT.   A presumptive positive result was obtained on the submitted specimen  and confirmed on repeat testing.  While 2019 novel coronavirus  (SARS-CoV-2) nucleic acids may be present in the submitted sample  additional confirmatory testing may be necessary for epidemiological  and / or clinical management purposes  to differentiate between  SARS-CoV-2 and other Sarbecovirus currently known to infect humans.  If clinically indicated  additional testing with an alternate test  methodology [phone]) is advised. The SARS-CoV-2 RNA is generally  detectable in upper and lower respiratory sp ecimens during the acute  phase of infection. The expected result is Negative. Fact Sheet for Patients:  BoilerBrush.com.cy Fact Sheet for Healthcare Providers: https://pope.com/ This test is not yet approved or cleared by the [place] FDA and has been authorized for detection and/or diagnosis of SARS-CoV-2 by FDA under an Emergency Use Authorization (EUA).  This EUA will remain in effect (meaning this test can be used) for the duration of the COVID-19 declaration under Section 564(b)(1) of the Act, 21 U.S.C. section 360bbb-3(b)(1), unless the authorization is terminated or revoked sooner. Performed at [facility], [address] [place], [place] [address]     Medications:  Current Facility-Administered Medications  Medication Dose Route Frequency Provider Last Rate Last Dose  . acetaminophen (TYLENOL) tablet 650 mg  650 mg Oral Q4H PRN Tilden Fossa, MD      . alum & mag hydroxide-simeth (MAALOX/MYLANTA) 200-200-20 MG/5ML suspension 30 mL  30 mL Oral Q6H PRN Tilden Fossa, MD      . carvedilol (COREG) tablet 12.5 mg  12.5 mg Oral BID WC Tilden Fossa, MD   12.5 mg at 11/17/18 0802  . divalproex (DEPAKOTE) DR tablet 250 mg  250 mg Oral Q12H Charm Rings, NP   250 mg at 11/17/18 0950  . dorzolamide-timolol (COSOPT) 22.3-6.8 MG/ML ophthalmic solution 1 drop  1 drop Both Eyes BID Tilden Fossa, MD   1 drop at 11/17/18 [phone]  . hydrALAZINE (APRESOLINE) tablet 10 mg  10 mg Oral Q6H Long, Arlyss Repress, MD   10 mg at 11/17/18 1140  . hydrochlorothiazide (MICROZIDE) capsule 12.5 mg  12.5 mg Oral Daily Tilden Fossa, MD   12.5 mg at 11/17/18 0950  . irbesartan (AVAPRO) tablet 300 mg  300 mg Oral Daily Tilden Fossa, MD   300 mg at 11/17/18 0950  .  levothyroxine (SYNTHROID) tablet 50 mcg  50 mcg Oral Q0600 Tilden Fossa, MD   50 mcg at 11/17/18 0554  . LORazepam (ATIVAN) tablet 0.25 mg  0.25 mg Oral TID PRN Tilden Fossa, MD   0.25 mg at 11/15/18 2228  . memantine (NAMENDA) tablet 10 mg  10 mg Oral  BID Tilden Fossaees, Elizabeth, MD   10 mg at 11/17/18 0950  . OLANZapine (ZYPREXA) injection 5 mg  5 mg Intramuscular Q12H PRN Charm RingsLord, Jamison Y, NP      . ondansetron [facility](ZOFRAN) tablet 4 mg  4 mg Oral Q8H PRN Tilden Fossaees, Elizabeth, MD      . traZODone (DESYREL) tablet 50 mg  50 mg Oral Thresa RossQHS Rees, Elizabeth, MD   50 mg at 11/16/18 2118   Current Outpatient Medications  Medication Sig Dispense Refill  . Calcium Carbonate-Vitamin D 600-400 MG-UNIT per tablet Take 1 tablet by mouth daily.     . carvedilol (COREG) 12.5 MG tablet Take 1 tablet (12.5 mg total) by mouth 2 (two) times daily with a meal. 30 tablet 0  . cholecalciferol (VITAMIN D) 1000 UNITS tablet Take 2,000 Units by mouth daily.    Marland Kitchen. denosumab (PROLIA) 60 MG/ML SOLN injection Inject 60 mg into the skin every 6 (six) months.     . diphenhydramine-acetaminophen (TYLENOL PM) 25-500 MG TABS tablet Take 1 tablet by mouth at bedtime as needed (sleep).    . furosemide (LASIX) 20 MG tablet Take 20 mg by mouth daily.     Marland Kitchen. levothyroxine (SYNTHROID, LEVOTHROID) 50 MCG tablet Take 50 mcg by mouth daily with breakfast.     . memantine (NAMENDA) 10 MG tablet Take 10 mg by mouth 2 (two) times daily.     . potassium chloride SA (K-DUR) 20 MEQ tablet Take 20 mEq by mouth daily.    . risperiDONE (RISPERDAL) 0.25 MG tablet Take 0.25-0.5 mg by mouth See admin instructions. 0.25 in the am and 0.25 lunch 0.5mg  at bedtime    . valsartan-hydrochlorothiazide (DIOVAN HCT) 320-12.5 MG tablet Take 1 tablet by mouth daily. 30 tablet 0  . divalproex (DEPAKOTE) 250 MG DR tablet Take 1 tablet (250 mg total) by mouth every 12 (twelve) hours. 28 tablet 0  . LORazepam (ATIVAN) 1 MG tablet Take 0.5-1 tablets (0.5-1 mg total) by mouth  every 8 (eight) hours as needed for anxiety. 1 tablet 0  . traZODone (DESYREL) 50 MG tablet Take 1 tablet (50 mg total) by mouth at bedtime. 14 tablet 0    Musculoskeletal: Strength & Muscle Tone: Spontaneously moves all 4 extremities.  Gait & Station: UTA since patient is sitting in bed. Patient leans: N/A  Psychiatric Specialty Exam: Physical Exam  Nursing note and vitals reviewed. Constitutional: She appears well-developed and well-nourished.  HENT:  Head: Normocephalic and atraumatic.  Neck: Normal range of motion.  Respiratory: Effort normal.  Musculoskeletal: Normal range of motion.  Neurological: She is alert.  Oriented to person only.   Psychiatric: She has a normal mood and affect. Her speech is normal and behavior is normal. Judgment and thought content normal. Cognition and memory are impaired.    Review of Systems  Psychiatric/Behavioral: Negative for depression, hallucinations and suicidal ideas. The patient does not have insomnia.   All other systems reviewed and are negative.   Blood pressure (!) 143/93, pulse 60, temperature 98 F (36.7 C), temperature source Oral, resp. rate 14, height 5\' 6"  (1.676 m), weight 73 kg, SpO2 97 %.Body mass index is 25.99 kg/m.  General Appearance: Fairly Groomed, elderly, Caucasian female who is sitting in bed and eating lunch. NAD.   Eye Contact:  Good  Speech:  Clear and Coherent and Normal Rate  Volume:  Normal  Mood:  Euthymic  Affect:  Appropriate and Congruent  Thought Process:  Linear and Descriptions of Associations: Intact  Orientation:  Other:  Only oriented to person.  Thought Content:  Logical  Suicidal Thoughts:  No  Homicidal Thoughts:  No  Memory:  Immediate;   Poor Recent;   Fair Remote;   Fair  Judgement:  Poor  Insight:  Shallow  Psychomotor Activity:  Normal  Concentration:  Concentration: Good and Attention Span: Good  Recall:  Poor  Fund of Knowledge:  Fair  Language:  Fair  Akathisia:  No  Handed:   Right  AIMS (if indicated):   N/A  Assets:  Engineer, maintenance Physical Health Resilience Social Support  ADL's:  Intact  Cognition:  Impaired. History of dementia.   Sleep:   Fair   Assessment:  DAHLYLA GARIBALDI is a 85 y.o. female who was admitted with agitation in the setting of dementia. Medications were adjusted in the ED and patient has responded with good effect. She had no behavioral problems overnight. Today she is only oriented to person but appropriate in behavior. She does not warrant inpatient psychiatric hospitalization at this time.   Treatment Plan Summary: Daily contact with patient to assess and evaluate symptoms and progress in treatment and Medication management  -Continue Depakote 250 mg BID for mood stabilization.  -Continue Namenda 10 mg BID for dementia.  -Continue Trazodone 50 mg qhs for insomnia/agitation.  -Continue home Risperdal for psychosis at discharge.   Disposition: No evidence of imminent risk to self or others at present.   Patient does not meet criteria for psychiatric inpatient admission.  This service was provided via telemedicine using a 2-way, interactive audio and video technology.  Names of all persons participating in this telemedicine service and their role in this encounter. Name: Juanetta Beets Role: Psychiatrist    Cherly Beach, DO 11/17/2018 12:04 PM

## 2018-11-17 NOTE — [place] FL2 (Addendum)
[place] MEDICAID FL2 LEVEL OF CARE SCREENING TOOL     IDENTIFICATION  Patient Name: Julie [name] Birthdate: 1933/02/16 Sex: female Admission Date (Current Location): 11/14/2018  [facility] [place] Number:  Producer, television/film/video and Address:  [facility],  [address], [place] [address]      Provider Number: [id number]  Attending Physician Name and Address:  Default, Provider, MD  Relative Name and Phone Number:       Current Level of Care: Hospital Recommended Level of Care: Memory Care Prior Approval Number:    Date Approved/Denied:   PASRR Number:    Discharge Plan: Other (Comment)(Memory Care)    Current Diagnoses: Patient Active Problem List   Diagnosis Date Noted  . UTI (urinary tract infection) 12/18/2016  . Dementia (HCC) 12/16/2016  . Essential hypertension 12/16/2016  . Hypothyroidism 12/16/2016  . Hypokalemia 12/16/2016  . Lacunar stroke (HCC) 12/16/2016  . Aseptic loosening of prosthetic hip (HCC) 04/07/2012  . Degenerative arthritis of hip 11/29/2011  . Fatty liver 02/12/2011  . Abnormal liver function tests 01/31/2011  . GERD (gastroesophageal reflux disease) 01/31/2011  . Constipation, slow transit 01/31/2011  . Chronic RUQ pain 01/31/2011  . Esophageal reflux 12/18/2010  . Rectal bleeding 12/18/2010  . Chronic epigastric pain 12/18/2010  . Gastritis, chronic 12/18/2010  . Diverticulosis of colon (without mention of hemorrhage) 12/18/2010  . Liver function study, abnormal 12/18/2010    Orientation RESPIRATION BLADDER Height & Weight     Self, Time, Situation, Place(orientation fluctuates)  Normal Continent Weight: 161 lb (73 kg) Height:  5\' 6"  (167.6 cm)  BEHAVIORAL SYMPTOMS/MOOD NEUROLOGICAL BOWEL NUTRITION STATUS      Continent Diet(normal)  AMBULATORY STATUS COMMUNICATION OF NEEDS Skin   Supervision Verbally Normal                       Personal Care Assistance Level of Assistance  Feeding, Bathing,  Dressing Bathing Assistance: Independent Feeding assistance: Independent Dressing Assistance: Independent     Functional Limitations Info  Sight, Hearing, Speech Sight Info: Adequate Hearing Info: Adequate Speech Info: Adequate    SPECIAL CARE FACTORS FREQUENCY  (follow up with primary care)                    Contractures Contractures Info: Not present    Additional Factors Info  Code Status, Allergies, Psychotropic Code Status Info: Full Allergies Info: SULFA ANTIBIOTICS, PENICILLINS CROSS REACTORS, SULFA DRUGS CROSS REACTORS  Psychotropic Info: divalproex (DEPAKOTE) DR tablet 250 mg; traZODone (DESYREL) tablet 50 mg          Current Medications (11/17/2018):  This is the current hospital active medication list Current Facility-Administered Medications  Medication Dose Route Frequency Provider Last Rate Last Dose  . acetaminophen (TYLENOL) tablet 650 mg  650 mg Oral Q4H PRN Tilden Fossa, MD      . alum & mag hydroxide-simeth (MAALOX/MYLANTA) 200-200-20 MG/5ML suspension 30 mL  30 mL Oral Q6H PRN Tilden Fossa, MD      . carvedilol (COREG) tablet 12.5 mg  12.5 mg Oral BID WC Tilden Fossa, MD   12.5 mg at 11/17/18 0802  . divalproex (DEPAKOTE) DR tablet 250 mg  250 mg Oral Q12H Charm Rings, NP   250 mg at 11/17/18 0950  . dorzolamide-timolol (COSOPT) 22.3-6.8 MG/ML ophthalmic solution 1 drop  1 drop Both Eyes BID Tilden Fossa, MD   1 drop at 11/17/18 [phone]  . hydrALAZINE (APRESOLINE) tablet 10 mg  10 mg Oral Q6H Long, Arlyss RepressJoshua G, MD   10 mg at 11/17/18 1140  . hydrochlorothiazide (MICROZIDE) capsule 12.5 mg  12.5 mg Oral Daily Tilden Fossaees, Elizabeth, MD   12.5 mg at 11/17/18 0950  . irbesartan (AVAPRO) tablet 300 mg  300 mg Oral Daily Tilden Fossaees, Elizabeth, MD   300 mg at 11/17/18 0950  . levothyroxine (SYNTHROID) tablet 50 mcg  50 mcg Oral Q0600 Tilden Fossaees, Elizabeth, MD   50 mcg at 11/17/18 0554  . LORazepam (ATIVAN) tablet 0.25 mg  0.25 mg Oral TID PRN Tilden Fossaees, Elizabeth, MD    0.25 mg at 11/15/18 2228  . memantine (NAMENDA) tablet 10 mg  10 mg Oral BID Tilden Fossaees, Elizabeth, MD   10 mg at 11/17/18 0950  . OLANZapine (ZYPREXA) injection 5 mg  5 mg Intramuscular Q12H PRN Charm RingsLord, Jamison Y, NP      . ondansetron [facility](ZOFRAN) tablet 4 mg  4 mg Oral Q8H PRN Tilden Fossaees, Elizabeth, MD      . traZODone (DESYREL) tablet 50 mg  50 mg Oral Thresa RossQHS Rees, Elizabeth, MD   50 mg at 11/16/18 2118   Current Outpatient Medications  Medication Sig Dispense Refill  . Calcium Carbonate-Vitamin D 600-400 MG-UNIT per tablet Take 1 tablet by mouth daily.     . carvedilol (COREG) 12.5 MG tablet Take 1 tablet (12.5 mg total) by mouth 2 (two) times daily with a meal. 30 tablet 0  . cholecalciferol (VITAMIN D) 1000 UNITS tablet Take 2,000 Units by mouth daily.    Marland Kitchen. denosumab (PROLIA) 60 MG/ML SOLN injection Inject 60 mg into the skin every 6 (six) months.     . diphenhydramine-acetaminophen (TYLENOL PM) 25-500 MG TABS tablet Take 1 tablet by mouth at bedtime as needed (sleep).    . furosemide (LASIX) 20 MG tablet Take 20 mg by mouth daily.     Marland Kitchen. levothyroxine (SYNTHROID, LEVOTHROID) 50 MCG tablet Take 50 mcg by mouth daily with breakfast.     . memantine (NAMENDA) 10 MG tablet Take 10 mg by mouth 2 (two) times daily.     . potassium chloride SA (K-DUR) 20 MEQ tablet Take 20 mEq by mouth daily.    . risperiDONE (RISPERDAL) 0.25 MG tablet Take 0.25-0.5 mg by mouth See admin instructions. 0.25 in the am and 0.25 lunch 0.5mg  at bedtime    . valsartan-hydrochlorothiazide (DIOVAN HCT) 320-12.5 MG tablet Take 1 tablet by mouth daily. 30 tablet 0  . divalproex (DEPAKOTE) 250 MG DR tablet Take 1 tablet (250 mg total) by mouth every 12 (twelve) hours. 28 tablet 0  . LORazepam (ATIVAN) 1 MG tablet Take 0.5-1 tablets (0.5-1 mg total) by mouth every 8 (eight) hours as needed for anxiety. 1 tablet 0  . traZODone (DESYREL) 50 MG tablet Take 1 tablet (50 mg total) by mouth at bedtime. 14 tablet 0     Discharge  Medications: Please see discharge summary for a list of discharge medications.  Relevant Imaging Results:  Relevant Lab Results:   Additional Information SS # [id number]  Montine CircleNovia C Stevenson, LCSW

## 2018-11-18 NOTE — ED Notes (Signed)
Pt transported to [facility] by [facility]. All belongings returned to pt. Pt went by [facility] because of advanced dementia and needed monitoring for safety of self and others as she cannot do so for herself.

## 2018-11-18 NOTE — ED Notes (Signed)
Spoke with Toni Amend at [facility] and they are ready for pt to be transported. Informed them that the PPD had been given.

## 2018-11-18 NOTE — ED Notes (Signed)
Pt continually pulling the curtain closed to sitter observing pt.  Pt arguing with the sitter.

## 2020-12-13 DEATH — deceased
